# Patient Record
Sex: Female | Born: 1980 | Race: Black or African American | Hispanic: No | Marital: Married | Smoking: Never smoker
Health system: Southern US, Community
[De-identification: ages and names within clinical notes are randomized; demographics above are authoritative.]

## PROBLEM LIST (undated history)

## (undated) DIAGNOSIS — Z9889 Other specified postprocedural states: Secondary | ICD-10-CM

## (undated) DIAGNOSIS — R519 Headache, unspecified: Secondary | ICD-10-CM

## (undated) DIAGNOSIS — R112 Nausea with vomiting, unspecified: Secondary | ICD-10-CM

## (undated) DIAGNOSIS — J45909 Unspecified asthma, uncomplicated: Secondary | ICD-10-CM

## (undated) DIAGNOSIS — M21611 Bunion of right foot: Secondary | ICD-10-CM

## (undated) DIAGNOSIS — R569 Unspecified convulsions: Secondary | ICD-10-CM

## (undated) DIAGNOSIS — F32A Depression, unspecified: Secondary | ICD-10-CM

## (undated) DIAGNOSIS — A6000 Herpesviral infection of urogenital system, unspecified: Secondary | ICD-10-CM

## (undated) DIAGNOSIS — K219 Gastro-esophageal reflux disease without esophagitis: Secondary | ICD-10-CM

## (undated) HISTORY — PX: FOOT SURGERY: SHX648

---

## 2005-05-03 HISTORY — PX: TUBAL LIGATION: SHX77

## 2007-05-04 HISTORY — PX: CERVICAL BIOPSY  W/ LOOP ELECTRODE EXCISION: SUR135

## 2011-05-04 HISTORY — PX: LAPAROSCOPIC OVARIAN CYSTECTOMY: SUR786

## 2013-05-03 HISTORY — PX: ESOPHAGOGASTRODUODENOSCOPY: SHX1529

## 2015-05-04 HISTORY — PX: HERNIA REPAIR: SHX51

## 2017-09-30 ENCOUNTER — Ambulatory Visit (HOSPITAL_COMMUNITY)
Admission: EM | Admit: 2017-09-30 | Discharge: 2017-09-30 | Disposition: A | Discharge status: Home / Self Care | Payer: Managed Care, Other (non HMO) | Attending: Family Medicine | Admitting: Family Medicine

## 2017-09-30 ENCOUNTER — Encounter (HOSPITAL_COMMUNITY): Payer: Self-pay

## 2017-09-30 DIAGNOSIS — R05 Cough: Secondary | ICD-10-CM | POA: Diagnosis not present

## 2017-09-30 DIAGNOSIS — J029 Acute pharyngitis, unspecified: Secondary | ICD-10-CM | POA: Diagnosis not present

## 2017-09-30 DIAGNOSIS — J069 Acute upper respiratory infection, unspecified: Secondary | ICD-10-CM | POA: Diagnosis present

## 2017-09-30 DIAGNOSIS — B9789 Other viral agents as the cause of diseases classified elsewhere: Secondary | ICD-10-CM | POA: Diagnosis not present

## 2017-09-30 LAB — POCT RAPID STREP A: Streptococcus, Group A Screen (Direct): NEGATIVE

## 2017-09-30 MED ORDER — FLUTICASONE PROPIONATE 50 MCG/ACT NA SUSP: 1.0000 | Freq: Every day | NASAL | 0 refills | Status: DC

## 2017-09-30 MED ORDER — BENZONATATE 200 MG PO CAPS: 200.0000 mg | ORAL_CAPSULE | Freq: Three times a day (TID) | ORAL | 0 refills | Status: AC | PRN

## 2017-09-30 MED ORDER — CETIRIZINE HCL 10 MG PO CAPS: 10.0000 mg | ORAL_CAPSULE | Freq: Every day | ORAL | 0 refills | Status: DC

## 2017-09-30 NOTE — Discharge Instructions (Signed)
Sore Throat  Your rapid strep tested Negative today. We will send for a culture and call in about 2 days if results are positive. For now we will treat your sore throat as a virus with symptom management.   Please continue Tylenol or Ibuprofen for fever and pain. May try salt water gargles, cepacol lozenges, throat spray, or OTC cold relief medicine for throat discomfort. If you also have congestion take a daily anti-histamine like Zyrtec, Claritin, and a oral decongestant to help with post nasal drip that may be irritating your throat.   Stay hydrated and drink plenty of fluids to keep your throat coated relieve irritation.   For congestion please begin daily Zyrtec and Flonase nasal spray.  Please use Tessalon for cough.  I expect symptoms to gradually improve over the next week.  Please return if symptoms not improving or symptoms worsening or changing.

## 2017-09-30 NOTE — ED Triage Notes (Signed)
Pt presents with complaints of body aches, cough, congestion and sore throat since Tuesday.

## 2017-10-01 NOTE — ED Provider Notes (Signed)
[facility]    CSN: [id number] Arrival date & time: 09/30/17  1059     History   Chief Complaint Chief Complaint  Patient presents with  . Influenza    HPI Sandra Boyer is a 37 y.o. female Patient is presenting with URI symptoms- congestion, cough, sore throat.  Also endorsing body aches, headache as well as hot and cold chills.  Patient's main complaints are cough and congestion. Symptoms have been going on for 2 to 3 days. Patient has tried cough drops and TheraFlu, with minimal relief. Denies fever, nausea, vomiting, diarrhea. Denies shortness of breath and chest pain.    HPI  History reviewed. No pertinent past medical history.  There are no active problems to display for this patient.   History reviewed. No pertinent surgical history.  OB History   None      Home Medications    Prior to Admission medications   Medication Sig Start Date End Date Taking? Authorizing Provider  albuterol (ACCUNEB) 0.63 MG/3ML nebulizer solution Take 1 ampule by nebulization every 6 (six) hours as needed for wheezing.   Yes [provider]  levonorgestrel (PLAN B 1-STEP) 1.5 MG tablet Take 1.5 mg by mouth once.   Yes [provider]  benzonatate (TESSALON) 200 MG capsule Take 1 capsule (200 mg total) by mouth 3 (three) times daily as needed for up to 7 days for cough. 09/30/17 10/07/17  Madelon Welsch C, PA-C  Cetirizine HCl 10 MG CAPS Take 1 capsule (10 mg total) by mouth daily for 10 days. 09/30/17 10/10/17  Shanikia Kernodle C, PA-C  fluticasone (FLONASE) 50 MCG/ACT nasal spray Place 1-2 sprays into both nostrils daily for 7 days. 09/30/17 10/07/17  Mickie Badders, Junius CreamerHallie C, PA-C    Family History Family History  Problem Relation Age of Onset  . Hypertension Mother   . Hypertension Father     Social History Social History   Tobacco Use  . Smoking status: Never Smoker  . Smokeless tobacco: Never Used  Substance Use Topics  . Alcohol use: Not Currently   Frequency: Never  . Drug use: Never     Allergies   Carbamazepine; Iodinated diagnostic agents; Mangifera indica; Oxcarbazepine; and Phenytoin   Review of Systems Review of Systems  Constitutional: Positive for chills. Negative for fatigue and fever.  HENT: Positive for congestion, rhinorrhea, sinus pressure and sore throat. Negative for ear pain and trouble swallowing.   Respiratory: Positive for cough. Negative for chest tightness and shortness of breath.   Cardiovascular: Negative for chest pain.  Gastrointestinal: Negative for abdominal pain, nausea and vomiting.  Musculoskeletal: Positive for myalgias.  Skin: Negative for rash.  Neurological: Positive for headaches. Negative for dizziness and light-headedness.     Physical Exam Triage Vital Signs ED Triage Vitals [09/30/17 1130]  Enc Vitals Group     BP 118/77     Pulse Rate 65     Resp 18     Temp 98.4 F (36.9 C)     Temp src      SpO2 100 %     Weight      Height      Head Circumference      Peak Flow      Pain Score 7     Pain Loc      Pain Edu?      Excl. in GC?    No data found.  Updated Vital Signs BP 118/77   Pulse 65   Temp 98.4 F (  36.9 C)   Resp 18   LMP 09/30/2017   SpO2 100%   Visual Acuity Right Eye Distance:   Left Eye Distance:   Bilateral Distance:    Right Eye Near:   Left Eye Near:    Bilateral Near:     Physical Exam  Constitutional: She appears well-developed and well-nourished. No distress.  HENT:  Head: Normocephalic and atraumatic.  Bilateral ears without tenderness to palpation of external auricle, tragus and mastoid, EAC's without erythema or swelling, TM's with good bony landmarks and cone of light. Non erythematous.  Oral mucosa pink and moist, no tonsillar enlargement or exudate. Posterior pharynx patent and erythematous, no uvula deviation or swelling. Normal phonation.  Eyes: Conjunctivae are normal.  Neck: Neck supple.  Cardiovascular: Normal rate and regular  rhythm.  No murmur heard. Pulmonary/Chest: Effort normal and breath sounds normal. No respiratory distress.  Breathing comfortably at rest, CTABL, no wheezing, rales or other adventitious sounds auscultated  Abdominal: Soft. There is no tenderness.  Musculoskeletal: She exhibits no edema.  Neurological: She is alert.  Skin: Skin is warm and dry.  Psychiatric: She has a normal mood and affect.  Nursing note and vitals reviewed.    UC Treatments / Results  Labs (all labs ordered are listed, but only abnormal results are displayed) Labs Reviewed  CULTURE, GROUP A STREP [facility])  POCT RAPID STREP A    EKG None  Radiology No results found.  Procedures Procedures (including critical care time)  Medications Ordered in UC Medications - No data to display  Initial Impression / Assessment and Plan / UC Course  I have reviewed the triage vital signs and the nursing notes.  Pertinent labs & imaging results that were available during my care of the patient were reviewed by me and considered in my medical decision making (see chart for details).     Patient with URI symptoms, likely viral etiology.  Vital signs stable, exam unremarkable.  Will recommend symptomatic management.  Recommendations below.Discussed strict return precautions. Patient verbalized understanding and is agreeable with plan.  Final Clinical Impressions(s) / UC Diagnoses   Final diagnoses:  Viral URI with cough     Discharge Instructions     Sore Throat  Your rapid strep tested Negative today. We will send for a culture and call in about 2 days if results are positive. For now we will treat your sore throat as a virus with symptom management.   Please continue Tylenol or Ibuprofen for fever and pain. May try salt water gargles, cepacol lozenges, throat spray, or OTC cold relief medicine for throat discomfort. If you also have congestion take a daily anti-histamine like Zyrtec, Claritin, and a oral  decongestant to help with post nasal drip that may be irritating your throat.   Stay hydrated and drink plenty of fluids to keep your throat coated relieve irritation.   For congestion please begin daily Zyrtec and Flonase nasal spray.  Please use Tessalon for cough.  I expect symptoms to gradually improve over the next week.  Please return if symptoms not improving or symptoms worsening or changing.   ED Prescriptions    Medication Sig Dispense Auth. Provider   Cetirizine HCl 10 MG CAPS Take 1 capsule (10 mg total) by mouth daily for 10 days. 10 capsule Muskaan Smet C, PA-C   fluticasone (FLONASE) 50 MCG/ACT nasal spray Place 1-2 sprays into both nostrils daily for 7 days. 1 g Mayson Sterbenz C, PA-C   benzonatate (TESSALON) 200 MG  capsule Take 1 capsule (200 mg total) by mouth 3 (three) times daily as needed for up to 7 days for cough. 28 capsule Giovan Pinsky C, PA-C     Controlled Substance Prescriptions  Controlled Substance Registry consulted? Not Applicable   Lew Dawes, [place] 10/01/17 1136

## 2017-10-02 LAB — CULTURE, GROUP A STREP (THRC)

## 2019-03-15 ENCOUNTER — Other Ambulatory Visit: Payer: Self-pay | Admitting: Sports Medicine

## 2019-03-15 DIAGNOSIS — G8929 Other chronic pain: Secondary | ICD-10-CM

## 2019-03-15 DIAGNOSIS — M25861 Other specified joint disorders, right knee: Secondary | ICD-10-CM

## 2019-03-27 ENCOUNTER — Ambulatory Visit
Admission: RE | Admit: 2019-03-27 | Discharge: 2019-03-27 | Disposition: A | Discharge status: Home / Self Care | Payer: Managed Care, Other (non HMO) | Source: Ambulatory Visit | Attending: Sports Medicine | Admitting: Sports Medicine

## 2019-03-27 ENCOUNTER — Other Ambulatory Visit: Payer: Self-pay

## 2019-03-27 DIAGNOSIS — M25561 Pain in right knee: Secondary | ICD-10-CM | POA: Insufficient documentation

## 2019-03-27 DIAGNOSIS — G8929 Other chronic pain: Secondary | ICD-10-CM | POA: Insufficient documentation

## 2019-03-27 DIAGNOSIS — M25861 Other specified joint disorders, right knee: Secondary | ICD-10-CM | POA: Insufficient documentation

## 2019-05-17 ENCOUNTER — Other Ambulatory Visit: Payer: Self-pay | Admitting: Neurology

## 2019-05-17 DIAGNOSIS — R200 Anesthesia of skin: Secondary | ICD-10-CM

## 2019-05-17 DIAGNOSIS — R202 Paresthesia of skin: Secondary | ICD-10-CM

## 2019-05-18 ENCOUNTER — Other Ambulatory Visit: Payer: Self-pay | Admitting: Neurology

## 2019-05-18 DIAGNOSIS — R202 Paresthesia of skin: Secondary | ICD-10-CM

## 2019-05-18 DIAGNOSIS — R200 Anesthesia of skin: Secondary | ICD-10-CM

## 2019-05-18 DIAGNOSIS — G8929 Other chronic pain: Secondary | ICD-10-CM

## 2019-05-24 ENCOUNTER — Ambulatory Visit
Admission: RE | Admit: 2019-05-24 | Discharge: 2019-05-24 | Disposition: A | Discharge status: Home / Self Care | Payer: Managed Care, Other (non HMO) | Source: Ambulatory Visit | Attending: Neurology | Admitting: Neurology

## 2019-05-24 ENCOUNTER — Other Ambulatory Visit: Payer: Self-pay

## 2019-05-24 DIAGNOSIS — R200 Anesthesia of skin: Secondary | ICD-10-CM | POA: Diagnosis present

## 2019-05-24 DIAGNOSIS — M5441 Lumbago with sciatica, right side: Secondary | ICD-10-CM | POA: Diagnosis present

## 2019-05-24 DIAGNOSIS — G8929 Other chronic pain: Secondary | ICD-10-CM | POA: Insufficient documentation

## 2019-05-24 DIAGNOSIS — M5442 Lumbago with sciatica, left side: Secondary | ICD-10-CM | POA: Insufficient documentation

## 2019-05-24 DIAGNOSIS — R202 Paresthesia of skin: Secondary | ICD-10-CM | POA: Insufficient documentation

## 2020-01-14 ENCOUNTER — Other Ambulatory Visit: Payer: Self-pay | Admitting: Podiatry

## 2020-01-22 ENCOUNTER — Other Ambulatory Visit: Payer: Self-pay

## 2020-01-22 ENCOUNTER — Encounter: Payer: Self-pay | Admitting: Podiatry

## 2020-01-25 ENCOUNTER — Other Ambulatory Visit
Admission: RE | Admit: 2020-01-25 | Discharge: 2020-01-25 | Disposition: A | Discharge status: Home / Self Care | Payer: Managed Care, Other (non HMO) | Source: Ambulatory Visit | Attending: Podiatry | Admitting: Podiatry

## 2020-01-25 ENCOUNTER — Other Ambulatory Visit: Payer: Self-pay

## 2020-01-25 DIAGNOSIS — Z20822 Contact with and (suspected) exposure to covid-19: Secondary | ICD-10-CM | POA: Insufficient documentation

## 2020-01-25 DIAGNOSIS — Z01812 Encounter for preprocedural laboratory examination: Secondary | ICD-10-CM | POA: Insufficient documentation

## 2020-01-25 LAB — SARS CORONAVIRUS 2 (TAT 6-24 HRS): SARS Coronavirus 2: NEGATIVE

## 2020-01-29 ENCOUNTER — Ambulatory Visit: Payer: Managed Care, Other (non HMO) | Admitting: Anesthesiology

## 2020-01-29 ENCOUNTER — Ambulatory Visit
Admission: RE | Admit: 2020-01-29 | Discharge: 2020-01-29 | Disposition: A | Discharge status: Home / Self Care | Payer: Managed Care, Other (non HMO) | Attending: Podiatry | Admitting: Podiatry

## 2020-01-29 ENCOUNTER — Other Ambulatory Visit: Payer: Self-pay

## 2020-01-29 ENCOUNTER — Encounter: Payer: Self-pay | Admitting: Podiatry

## 2020-01-29 ENCOUNTER — Encounter
Admission: RE | Disposition: A | Discharge status: Home / Self Care | Payer: Self-pay | Source: Home / Self Care | Attending: Podiatry

## 2020-01-29 DIAGNOSIS — K219 Gastro-esophageal reflux disease without esophagitis: Secondary | ICD-10-CM | POA: Diagnosis not present

## 2020-01-29 DIAGNOSIS — M216X1 Other acquired deformities of right foot: Secondary | ICD-10-CM | POA: Diagnosis not present

## 2020-01-29 DIAGNOSIS — G629 Polyneuropathy, unspecified: Secondary | ICD-10-CM | POA: Insufficient documentation

## 2020-01-29 DIAGNOSIS — Z91041 Radiographic dye allergy status: Secondary | ICD-10-CM | POA: Insufficient documentation

## 2020-01-29 DIAGNOSIS — Z888 Allergy status to other drugs, medicaments and biological substances status: Secondary | ICD-10-CM | POA: Insufficient documentation

## 2020-01-29 DIAGNOSIS — Z09 Encounter for follow-up examination after completed treatment for conditions other than malignant neoplasm: Secondary | ICD-10-CM

## 2020-01-29 DIAGNOSIS — M216X2 Other acquired deformities of left foot: Secondary | ICD-10-CM | POA: Insufficient documentation

## 2020-01-29 DIAGNOSIS — Z79899 Other long term (current) drug therapy: Secondary | ICD-10-CM | POA: Insufficient documentation

## 2020-01-29 DIAGNOSIS — J45909 Unspecified asthma, uncomplicated: Secondary | ICD-10-CM | POA: Diagnosis not present

## 2020-01-29 DIAGNOSIS — M2011 Hallux valgus (acquired), right foot: Secondary | ICD-10-CM | POA: Insufficient documentation

## 2020-01-29 DIAGNOSIS — Z91018 Allergy to other foods: Secondary | ICD-10-CM | POA: Insufficient documentation

## 2020-01-29 DIAGNOSIS — Z793 Long term (current) use of hormonal contraceptives: Secondary | ICD-10-CM | POA: Diagnosis not present

## 2020-01-29 HISTORY — PX: HALLUX VALGUS AUSTIN: SHX6623

## 2020-01-29 HISTORY — DX: Gastro-esophageal reflux disease without esophagitis: K21.9

## 2020-01-29 HISTORY — DX: Headache, unspecified: R51.9

## 2020-01-29 HISTORY — DX: Unspecified asthma, uncomplicated: J45.909

## 2020-01-29 HISTORY — DX: Bunion of right foot: M21.611

## 2020-01-29 HISTORY — DX: Nausea with vomiting, unspecified: R11.2

## 2020-01-29 HISTORY — DX: Depression, unspecified: F32.A

## 2020-01-29 HISTORY — DX: Unspecified convulsions: R56.9

## 2020-01-29 HISTORY — DX: Other specified postprocedural states: Z98.890

## 2020-01-29 HISTORY — DX: Herpesviral infection of urogenital system, unspecified: A60.00

## 2020-01-29 LAB — POCT PREGNANCY, URINE: Preg Test, Ur: NEGATIVE

## 2020-01-29 SURGERY — CORRECTION, HALLUX VALGUS
Anesthesia: Regional | Site: Foot | Laterality: Right

## 2020-01-29 MED ORDER — ROPIVACAINE HCL 5 MG/ML IJ SOLN
INTRAMUSCULAR | Status: DC | PRN
  Administered 2020-01-29: 15 mL via PERINEURAL
  Administered 2020-01-29: 35 mL via PERINEURAL

## 2020-01-29 MED ORDER — GLYCOPYRROLATE 0.2 MG/ML IJ SOLN
INTRAMUSCULAR | Status: DC | PRN
  Administered 2020-01-29: .1 mg via INTRAVENOUS

## 2020-01-29 MED ORDER — POVIDONE-IODINE 7.5 % EX SOLN: Freq: Once | CUTANEOUS | Status: DC

## 2020-01-29 MED ORDER — MIDAZOLAM HCL 2 MG/2ML IJ SOLN
INTRAMUSCULAR | Status: DC | PRN
  Administered 2020-01-29: 2 mg via INTRAVENOUS

## 2020-01-29 MED ORDER — ASPIRIN EC 325 MG PO TBEC: 325.0000 mg | DELAYED_RELEASE_TABLET | Freq: Every day | ORAL | 0 refills | Status: AC

## 2020-01-29 MED ORDER — FENTANYL CITRATE (PF) 100 MCG/2ML IJ SOLN
INTRAMUSCULAR | Status: DC | PRN
Start: 2020-01-29 — End: 2020-01-29
  Administered 2020-01-29: 100 ug via INTRAVENOUS

## 2020-01-29 MED ORDER — LIDOCAINE HCL (CARDIAC) PF 100 MG/5ML IV SOSY
PREFILLED_SYRINGE | INTRAVENOUS | Status: DC | PRN
  Administered 2020-01-29: 50 mg via INTRAVENOUS

## 2020-01-29 MED ORDER — ONDANSETRON HCL 4 MG/2ML IJ SOLN
4.0000 mg | Freq: Once | INTRAMUSCULAR | Status: AC | PRN
  Administered 2020-01-29: 4 mg via INTRAVENOUS

## 2020-01-29 MED ORDER — LACTATED RINGERS IV SOLN: INTRAVENOUS | Status: DC

## 2020-01-29 MED ORDER — HYDROCODONE-ACETAMINOPHEN 7.5-325 MG PO TABS
1.0000 | ORAL_TABLET | Freq: Four times a day (QID) | ORAL | 0 refills | Status: AC | PRN
Start: 2020-01-29 — End: 2020-02-05

## 2020-01-29 MED ORDER — DEXAMETHASONE SODIUM PHOSPHATE 4 MG/ML IJ SOLN
INTRAMUSCULAR | Status: DC | PRN
  Administered 2020-01-29: 4 mg via PERINEURAL

## 2020-01-29 MED ORDER — SCOPOLAMINE 1 MG/3DAYS TD PT72
1.0000 | MEDICATED_PATCH | TRANSDERMAL | Status: DC
  Administered 2020-01-29: 1.5 mg via TRANSDERMAL

## 2020-01-29 MED ORDER — PROPOFOL 500 MG/50ML IV EMUL
INTRAVENOUS | Status: DC | PRN
  Administered 2020-01-29: 100 ug/kg/min via INTRAVENOUS

## 2020-01-29 MED ORDER — OXYCODONE HCL 5 MG/5ML PO SOLN: 5.0000 mg | Freq: Once | ORAL | Status: DC | PRN

## 2020-01-29 MED ORDER — DROPERIDOL 2.5 MG/ML IJ SOLN: 0.6250 mg | Freq: Once | INTRAMUSCULAR | Status: DC | PRN

## 2020-01-29 MED ORDER — ONDANSETRON HCL 4 MG PO TABS: 4.0000 mg | ORAL_TABLET | Freq: Three times a day (TID) | ORAL | 0 refills | Status: AC | PRN

## 2020-01-29 MED ORDER — ONDANSETRON HCL 4 MG/2ML IJ SOLN
INTRAMUSCULAR | Status: DC | PRN
  Administered 2020-01-29: 4 mg via INTRAVENOUS

## 2020-01-29 MED ORDER — PROPOFOL 10 MG/ML IV BOLUS
INTRAVENOUS | Status: DC | PRN
  Administered 2020-01-29 (×2): 50 mg via INTRAVENOUS

## 2020-01-29 MED ORDER — OXYCODONE HCL 5 MG PO TABS: 5.0000 mg | ORAL_TABLET | Freq: Once | ORAL | Status: DC | PRN

## 2020-01-29 MED ORDER — CEFAZOLIN SODIUM-DEXTROSE 2-4 GM/100ML-% IV SOLN
2.0000 g | INTRAVENOUS | Status: AC
  Administered 2020-01-29: 2 g via INTRAVENOUS

## 2020-01-29 MED ORDER — CEPHALEXIN 500 MG PO CAPS: 500.0000 mg | ORAL_CAPSULE | Freq: Three times a day (TID) | ORAL | 0 refills | Status: AC

## 2020-01-29 MED ORDER — PROMETHAZINE HCL 25 MG/ML IJ SOLN: 6.2500 mg | INTRAMUSCULAR | Status: DC | PRN

## 2020-01-29 MED ORDER — FENTANYL CITRATE (PF) 100 MCG/2ML IJ SOLN: 25.0000 ug | INTRAMUSCULAR | Status: DC | PRN

## 2020-01-29 SURGICAL SUPPLY — 38 items
BIT DRILL 2.1 LONG CANN (MISCELLANEOUS) ×3 IMPLANT
BLADE OSC/SAGITTAL MD 9X18.5 (BLADE) ×3 IMPLANT
BNDG CMPR STD VLCR NS LF 5.8X4 (GAUZE/BANDAGES/DRESSINGS) ×2
BNDG CMPR STD VLCR NS LF 5.8X6 (GAUZE/BANDAGES/DRESSINGS) ×2
BNDG ELASTIC 4X5.8 VLCR NS LF (GAUZE/BANDAGES/DRESSINGS) ×4 IMPLANT
BNDG ELASTIC 6X5.8 VLCR NS LF (GAUZE/BANDAGES/DRESSINGS) ×4 IMPLANT
BNDG ESMARK 4X12 TAN STRL LF (GAUZE/BANDAGES/DRESSINGS) ×4 IMPLANT
BNDG GAUZE 4.5X4.1 6PLY STRL (MISCELLANEOUS) ×4 IMPLANT
CANISTER SUCT 1200ML W/VALVE (MISCELLANEOUS) ×4 IMPLANT
CLIP HMST11XOPN 235X2.8X (MISCELLANEOUS) ×1 IMPLANT
CLIP RESOLUTION 360 11X235 (MISCELLANEOUS) ×4
COVER LIGHT HANDLE FLEXIBLE (MISCELLANEOUS) ×8 IMPLANT
CUFF TOURN SGL QUICK 18X4 (TOURNIQUET CUFF) ×4 IMPLANT
DRAPE FLUOR MINI C-ARM 54X84 (DRAPES) ×4 IMPLANT
DURAPREP 26ML APPLICATOR (WOUND CARE) ×4 IMPLANT
ELECT REM PT RETURN 9FT ADLT (ELECTROSURGICAL) ×4
ELECTRODE REM PT RTRN 9FT ADLT (ELECTROSURGICAL) ×2 IMPLANT
GAUZE SPONGE 4X4 12PLY STRL (GAUZE/BANDAGES/DRESSINGS) ×4 IMPLANT
GAUZE XEROFORM 1X8 LF (GAUZE/BANDAGES/DRESSINGS) ×4 IMPLANT
GLOVE BIO SURGEON STRL SZ7 (GLOVE) ×4 IMPLANT
GLOVE BIOGEL PI IND STRL 7.0 (GLOVE) ×3 IMPLANT
GLOVE BIOGEL PI INDICATOR 7.0 (GLOVE) ×4
GOWN STRL REUS W/ TWL LRG LVL3 (GOWN DISPOSABLE) ×4 IMPLANT
GOWN STRL REUS W/TWL LRG LVL3 (GOWN DISPOSABLE) ×8
KIT TURNOVER KIT A (KITS) ×4 IMPLANT
NS IRRIG 500ML POUR BTL (IV SOLUTION) ×4 IMPLANT
PACK EXTREMITY ARMC (MISCELLANEOUS) ×4 IMPLANT
PADDING CAST BLEND 4X4 NS (MISCELLANEOUS) ×16 IMPLANT
PENCIL SMOKE EVACUATOR (MISCELLANEOUS) ×4 IMPLANT
SCREW CANN HEAD ST 3.0X20 (Screw) ×3 IMPLANT
SCREW HEADED COUNTER SINK 3.0 ×3 IMPLANT
SPLINT CAST 1 STEP 4X30 (MISCELLANEOUS) ×4 IMPLANT
STOCKINETTE IMPERVIOUS LG (DRAPES) ×4 IMPLANT
SUT MNCRL 4-0 (SUTURE) ×4
SUT MNCRL 4-0 27XMFL (SUTURE) ×2
SUT VIC AB 3-0 SH 27 (SUTURE) ×4
SUT VIC AB 3-0 SH 27X BRD (SUTURE) ×1 IMPLANT
SUTURE MNCRL 4-0 27XMF (SUTURE) ×1 IMPLANT

## 2020-01-29 NOTE — Anesthesia Procedure Notes (Deleted)
Anesthesia Procedure Note     

## 2020-01-29 NOTE — Anesthesia Procedure Notes (Signed)
Anesthesia Regional Block: Popliteal block   Pre-Anesthetic Checklist: ,, timeout performed, Correct Patient, Correct Site, Correct Laterality, Correct Procedure, Correct Position, site marked, Risks and benefits discussed,  Surgical consent,  Pre-op evaluation,  At surgeon's request and post-op pain management  Laterality: Right  Prep: chloraprep       Needles:  Injection technique: Single-shot  Needle Type: Stimiplex     Needle Length: 9cm  Needle Gauge: 21     Additional Needles:   Procedures:,,,, ultrasound used (permanent image in chart),,,,  Narrative:  Start time: 01/29/2020 11:25 AM End time: 01/29/2020 11:30 AM Injection made incrementally with aspirations every 5 mL.  Performed by: Personally  Anesthesiologist: Jarome Matin, MD

## 2020-01-29 NOTE — Anesthesia Procedure Notes (Signed)
Anesthesia Regional Block: Adductor canal block   Pre-Anesthetic Checklist: ,, timeout performed, Correct Patient, Correct Site, Correct Laterality, Correct Procedure, Correct Position, site marked, Risks and benefits discussed,  Surgical consent,  Pre-op evaluation,  At surgeon's request and post-op pain management  Laterality: Right  Prep: chloraprep       Needles:  Injection technique: Single-shot  Needle Type: Stimiplex     Needle Length: 9cm  Needle Gauge: 21     Additional Needles:   Procedures:,,,, ultrasound used (permanent image in chart),,,,  Narrative:  Start time: 01/29/2020 11:20 AM End time: 01/29/2020 11:25 AM  Performed by: Personally  Anesthesiologist: Jarome Matin, MD

## 2020-01-29 NOTE — H&P (Signed)
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  The various methods of treatment have been discussed with the patient.  No guarantees were given.  After consideration of risks, benefits and other options for treatment, the patient has consented to surgery.  I have reviewed the patients' chart and labs.    PROCEDURE: RIGHT AUSTIN/AKIN BUNIONECTOMY   A history and physical examination was performed in my office.  The patient was reexamined.  There have been no changes to this history and physical examination.  Rosetta Posner, DPM

## 2020-01-29 NOTE — Anesthesia Postprocedure Evaluation (Signed)
Anesthesia Post Note  Patient: Sandra Boyer  Procedure(s) Performed: HALLUX VALGUS AUSTIN RIGHT (Right Foot)     Patient location during evaluation: PACU Anesthesia Type: Regional Level of consciousness: awake and alert Pain management: pain level controlled Vital Signs Assessment: post-procedure vital signs reviewed and stable Respiratory status: spontaneous breathing, nonlabored ventilation, respiratory function stable and patient connected to nasal cannula oxygen Cardiovascular status: blood pressure returned to baseline and stable Postop Assessment: no apparent nausea or vomiting Anesthetic complications: no   No complications documented.  Gayland Curry Johanan Skorupski

## 2020-01-29 NOTE — Op Note (Signed)
PODIATRY / FOOT AND ANKLE SURGERY OPERATIVE REPORT    SURGEON: Caroline More, DPM  PRE-OPERATIVE DIAGNOSIS:  1.  Right hallux valgus  POST-OPERATIVE DIAGNOSIS: Same  PROCEDURE(S): 1. Right Austin bunionectomy with modified McBride procedure and medial capsulorrhaphy  HEMOSTASIS: Right ankle tourniquet  ANESTHESIA: MAC  ESTIMATED BLOOD LOSS: 10 cc  FINDING(S): 1.  Right hallux valgus with prominent medial eminence  PATHOLOGY/SPECIMEN(S): None  INDICATIONS:   Sandra Boyer is a 39 y.o. female who presents with a painful bunion to the right first metatarsal phalangeal joint.  Patient has been worked up for this point and has exhausted conservative measures and presents today for surgery.  Discussed all treatment options with the patient prior to surgery both conservative and surgical attempts at correction.  Patient feels as though she is exhausted all conservative measures and would like to proceed with surgery today consisting of right Austin bunionectomy with potential Akin osteotomy..  DESCRIPTION: After obtaining full informed written consent, the patient was brought back to the operating room and placed supine upon the operating table.  The patient received IV antibiotics prior to induction.  After obtaining adequate anesthesia, the patient was prepped and draped in the standard fashion.  An Esmarch bandage used to exsanguinate the right lower extremity and the pneumatic ankle tourniquet was inflated.  Attention was then directed to the dorsal medial aspect of the right first metatarsal phalangeal joint where a linear longitudinal incision was made over this area.  The incision was deepened to the subcutaneous tissues utilizing sharp and blunt dissection care was taken to identify and retract all vital neurovascular structures and all venous contributories were cauterized as necessary.  At this time a linear capsulotomy was made over the dorsal medial first metatarsal phalangeal joint  medial to the extensor hallucis longus tendon.  The capsular and periosteal tissue was reflected medially and laterally thereby exposing the first metatarsal head and proximal phalanx base at the operative site.  The sagittal bone saw was then used to resect the medial eminence which was passed off the operative site and a small dorsal medial eminence was also resected and passed off the operative site.  Attention was then directed to the first interspace via the same incision where the extensor hallucis longus tendon was retracted medially and the skin and subcutaneous tissue was retracted laterally.  Dissection was then continued down to the capsule where a lateral release was performed releasing the suspensory and lateral capsule ligaments and the conjoined tendon of the adductor hallucis.  The hallux abductus angle appeared to be in relatively normal position at this time.  Attention was then directed to the medial aspect of the first metatarsal head area where a Austin type osteotomy was performed with the apex of the osteotomy being the central aspect of the metatarsal head with arms extending dorsal proximal and plantar proximal leaving a slightly longer plantar on the dorsal arm.  The capital fragment was then shifted approximately 3 to 4 mm laterally and then held into place with a K wire for 3.0 cannulated Paragon 28 screw that was directed through the distal first metatarsal shaft dorsally across the osteotomy site and into the plantar distal aspect of the capital fragment.  C-arm imaging was utilized to verify correct position which appeared to be excellent.  The sesamoids appeared to be underneath the first metatarsal head and the wire appeared to be in the appropriate position overall.  The intermetatarsal angle appeared to be reduced to within normal limits around 8  to 9 degrees.  The hallux abductus angle also appeared to be relatively rectus overall.  Utilizing standard AO principles techniques a  3.0 x 20 mm partially-threaded Paragon 28 cannulated screw was placed across the osteotomy site from dorsal plantar with excellent compression noted.  The guidewire was removed and passed off the operative site.  The medial overhang was then resected with a sagittal bone saw.  A flush was performed with copious amounts normal sterile saline at the operative site.  C-arm imaging was utilized to verify correct position of screwing which appeared to be excellent.  It appeared to be of the appropriate length as well.  The capsular and periosteal tissues were then reapproximated well coapted after a small section of the medial capsule was resected to tighten the medial capsule to make the toe slightly straighter.  The toe appeared to sit in a rectus position overall radiographically with the C arm and with clinical evaluation.  The capsule and periosteal tissue was reapproximated well coapted with 3-0 Vicryl.  The subcutaneous tissue was reapproximated well coapted with 4-0 Monocryl.  The subcuticular tissue/skin was approximated well coapted with a running 4-0 Monocryl subcuticular stitch.  Mastisol and Steri-Strips were then applied.  A postoperative dressing was applied consisting of Xeroform to the incision line followed by 4 x 4 gauze, conform, Kerlix, Ace wrap.  The pneumatic ankle tourniquet was deflated and a prompt hyperemic response was noted all digits of the right foot.  Final C-arm imaging was then taken showing correction of the bunion deformity with first intermetatarsal space being within normal limits to about 8 to 9 degrees with the hallux abductus angle appearing relatively neutral to slight abductus 5 degrees.  The patient's boot was then applied.  The patient tolerated the procedure and anesthesia well was transferred to recovery room vital signs stable vascular status intact all toes the right foot.  Following appear to postoperative monitoring the patient be discharged home with the appropriate  follow-up and discharge instructions and medications have been sent to the pharmacy.  Patient should follow-up in clinic within 1 week of surgical date.  COMPLICATIONS: None  CONDITION: Good, stable  Caroline More, DPM

## 2020-01-29 NOTE — Discharge Instructions (Signed)
[facility]  POST OPERATIVE INSTRUCTIONS FOR DR. TROXLER, DR. Ether Griffins, AND [facility]   1. Take your medication as prescribed.  Pain medication should be taken only as needed.  Take 1 Norco 7/325 every 6 hours as needed for pain.  May take ibuprofen or tylenol between pain doses if needed.  If pain still uncontrolled then take medication every 4 hours as needed for pain.  If still extreme pain then take 2 pills every 6 hours as needed.  Take sparringly.  2. Keep the dressing clean, dry and intact.  3. Keep your foot elevated above the heart level for the first 48 hours.  Continue elevation thereafter for swelling control.  4. Walking to the bathroom and brief periods of walking are acceptable, unless we have instructed you to be non-weight bearing.  Wear the boot at all times when ambulating and try to walk on heel only for short distances and stay off foot for the most part.  5. Always wear your post-op boot when walking.  Always use your crutches if you are to be non-weight bearing.  6. Do not take a shower. Baths are permissible as long as the foot is kept out of the water.   7. Every hour you are awake:  - Bend your knee 15 times. - Flex foot 15 times - Massage calf 15 times  8. Call [facility] [phone]) if any of the following problems occur: - You develop a temperature or fever. - The bandage becomes saturated with blood. - Medication does not stop your pain. - Injury of the foot occurs. - Any symptoms of infection including redness, odor, or red streaks running from wound.   General Anesthesia, Adult, Care After This sheet gives you information about how to care for yourself after your procedure. Your health care provider may also give you more specific instructions. If you have problems or questions, contact your health care provider. What can I expect after the procedure? After the  procedure, the following side effects are common:  Pain or discomfort at the IV site.  Nausea.  Vomiting.  Sore throat.  Trouble concentrating.  Feeling cold or chills.  Weak or tired.  Sleepiness and fatigue.  Soreness and body aches. These side effects can affect parts of the body that were not involved in surgery. Follow these instructions at home:  For at least 24 hours after the procedure:  Have a responsible adult stay with you. It is important to have someone help care for you until you are awake and alert.  Rest as needed.  Do not: ? Participate in activities in which you could fall or become injured. ? Drive. ? Use heavy machinery. ? Drink alcohol. ? Take sleeping pills or medicines that cause drowsiness. ? Make important decisions or sign legal documents. ? Take care of children on your own. Eating and drinking  Follow any instructions from your health care provider about eating or drinking restrictions.  When you feel hungry, start by eating small amounts of foods that are soft and easy to digest (bland), such as toast. Gradually return to your regular diet.  Drink enough fluid to keep your urine pale yellow.  If you vomit, rehydrate by drinking water, juice, or clear broth. General instructions  If you have sleep apnea, surgery and certain medicines can increase your risk for breathing problems. Follow instructions from your health care provider about wearing your sleep device: ? Anytime you  are sleeping, including during daytime naps. ? While taking prescription pain medicines, sleeping medicines, or medicines that make you drowsy.  Return to your normal activities as told by your health care provider. Ask your health care provider what activities are safe for you.  Take over-the-counter and prescription medicines only as told by your health care provider.  If you smoke, do not smoke without supervision.  Keep all follow-up visits as told by your  health care provider. This is important. Contact a health care provider if:  You have nausea or vomiting that does not get better with medicine.  You cannot eat or drink without vomiting.  You have pain that does not get better with medicine.  You are unable to pass urine.  You develop a skin rash.  You have a fever.  You have redness around your IV site that gets worse. Get help right away if:  You have difficulty breathing.  You have chest pain.  You have blood in your urine or stool, or you vomit blood. Summary  After the procedure, it is common to have a sore throat or nausea. It is also common to feel tired.  Have a responsible adult stay with you for the first 24 hours after general anesthesia. It is important to have someone help care for you until you are awake and alert.  When you feel hungry, start by eating small amounts of foods that are soft and easy to digest (bland), such as toast. Gradually return to your regular diet.  Drink enough fluid to keep your urine pale yellow.  Return to your normal activities as told by your health care provider. Ask your health care provider what activities are safe for you. This information is not intended to replace advice given to you by your health care provider. Make sure you discuss any questions you have with your health care provider. Document Revised: 04/22/2017 Document Reviewed: 12/03/2016 Elsevier Patient Education  2020 Elsevier Inc.  Scopolamine skin patches Remove in 72 hrs. Wash hands immediately after removal. What is this medicine? SCOPOLAMINE (skoe POL a meen) is used to prevent nausea and vomiting caused by motion sickness, anesthesia and surgery. This medicine may be used for other purposes; ask your health care provider or pharmacist if you have questions. COMMON BRAND NAME(S): Transderm Scop What should I tell my health care provider before I take this medicine? They need to know if you have any of these  conditions:  are scheduled to have a gastric secretion test  glaucoma  heart disease  kidney disease  liver disease  lung or breathing disease, like asthma  mental illness  prostate disease  seizures  stomach or intestine problems  trouble passing urine  an unusual or allergic reaction to scopolamine, atropine, other medicines, foods, dyes, or preservatives  pregnant or trying to get pregnant  breast-feeding How should I use this medicine? This medicine is for external use only. Follow the directions on the prescription label. Wear only 1 patch at a time. Choose an area behind the ear, that is clean, dry, hairless and free from any cuts or irritation. Wipe the area with a clean dry tissue. Peel off the plastic backing of the skin patch, trying not to touch the adhesive side with your hands. Do not cut the patches. Firmly apply to the area you have chosen, with the metallic side of the patch to the skin and the tan-colored side showing. Once firmly in place, wash your hands well with soap  and water. Do not get this medicine into your eyes. After removing the patch, wash your hands and the area behind your ear thoroughly with soap and water. The patch will still contain some medicine after use. To avoid accidental contact or ingestion by children or pets, fold the used patch in half with the sticky side together and throw away in the trash out of the reach of children and pets. If you need to use a second patch after you remove the first, place it behind the other ear. A special MedGuide will be given to you by the pharmacist with each prescription and refill. Be sure to read this information carefully each time. Talk to your pediatrician regarding the use of this medicine in children. Special care may be needed. Overdosage: If you think you have taken too much of this medicine contact a poison control center or emergency room at once. NOTE: This medicine is only for you. Do not  share this medicine with others. What if I miss a dose? This does not apply. This medicine is not for regular use. What may interact with this medicine?  alcohol  antihistamines for allergy cough and cold  atropine  certain medicines for anxiety or sleep  certain medicines for bladder problems like oxybutynin, tolterodine  certain medicines for depression like amitriptyline, fluoxetine, sertraline  certain medicines for stomach problems like dicyclomine, hyoscyamine  certain medicines for Parkinson's disease like benztropine, trihexyphenidyl  certain medicines for seizures like phenobarbital, primidone  general anesthetics like halothane, isoflurane, methoxyflurane, propofol  ipratropium  local anesthetics like lidocaine, pramoxine, tetracaine  medicines that relax muscles for surgery  phenothiazines like chlorpromazine, mesoridazine, prochlorperazine, thioridazine  narcotic medicines for pain  other belladonna alkaloids This list may not describe all possible interactions. Give your health care provider a list of all the medicines, herbs, non-prescription drugs, or dietary supplements you use. Also tell them if you smoke, drink alcohol, or use illegal drugs. Some items may interact with your medicine. What should I watch for while using this medicine? Limit contact with water while swimming and bathing because the patch may fall off. If the patch falls off, throw it away and put a new one behind the other ear. You may get drowsy or dizzy. Do not drive, use machinery, or do anything that needs mental alertness until you know how this medicine affects you. Do not stand or sit up quickly, especially if you are an older patient. This reduces the risk of dizzy or fainting spells. Alcohol may interfere with the effect of this medicine. Avoid alcoholic drinks. Your mouth may get dry. Chewing sugarless gum or sucking hard candy, and drinking plenty of water may help. Contact your  healthcare professional if the problem does not go away or is severe. This medicine may cause dry eyes and blurred vision. If you wear contact lenses, you may feel some discomfort. Lubricating drops may help. See your healthcare professional if the problem does not go away or is severe. If you are going to need surgery, an MRI, CT scan, or other procedure, tell your healthcare professional that you are using this medicine. You may need to remove the patch before the procedure. What side effects may I notice from receiving this medicine? Side effects that you should report to your doctor or health care professional as soon as possible:  allergic reactions like skin rash, itching or hives; swelling of the face, lips, or tongue  blurred vision  changes in vision  confusion  dizziness  eye pain  fast, irregular heartbeat  hallucinations, loss of contact with reality  nausea, vomiting  pain or trouble passing urine  restlessness  seizures  skin irritation  stomach pain Side effects that usually do not require medical attention (report to your doctor or health care professional if they continue or are bothersome):  drowsiness  dry mouth  headache  sore throat This list may not describe all possible side effects. Call your doctor for medical advice about side effects. You may report side effects to FDA at 1-800-FDA-1088. Where should I keep my medicine? Keep out of the reach of children. Store at room temperature between 20 and 25 degrees C (68 and 77 degrees F). Keep this medicine in the foil package until ready to use. Throw away any unused medicine after the expiration date. NOTE: This sheet is a summary. It may not cover all possible information. If you have questions about this medicine, talk to your doctor, pharmacist, or health care provider.  2020 Elsevier/Gold Standard (2017-07-08 16:14:46)

## 2020-01-29 NOTE — Anesthesia Procedure Notes (Signed)
Date/Time: 01/29/2020 12:07 PM Performed by: Maree Krabbe, CRNA Pre-anesthesia Checklist: Patient identified, Emergency Drugs available, Suction available, Timeout performed and Patient being monitored Patient Re-evaluated:Patient Re-evaluated prior to induction Oxygen Delivery Method: Nasal cannula Placement Confirmation: positive ETCO2

## 2020-01-29 NOTE — Transfer of Care (Signed)
Immediate Anesthesia Transfer of Care Note  Patient: Sandra Boyer  Procedure(s) Performed: HALLUX VALGUS AUSTIN RIGHT (Right Foot)  Patient Location: PACU  Anesthesia Type: General, Regional  Level of Consciousness: awake, alert  and patient cooperative  Airway and Oxygen Therapy: Patient Spontanous Breathing and Patient connected to supplemental oxygen  Post-op Assessment: Post-op Vital signs reviewed, Patient's Cardiovascular Status Stable, Respiratory Function Stable, Patent Airway and No signs of Nausea or vomiting  Post-op Vital Signs: Reviewed and stable  Complications: No complications documented.

## 2020-01-29 NOTE — Anesthesia Preprocedure Evaluation (Signed)
Anesthesia Evaluation  Patient identified by MRN, date of birth, ID band  History of Anesthesia Complications (+) PONV and history of anesthetic complications (ponv)  Airway Mallampati: II  TM Distance: >3 FB Neck ROM: Full    Dental   Pulmonary asthma ,    Pulmonary exam normal        Cardiovascular negative cardio ROS Normal cardiovascular exam     Neuro/Psych  Headaches, PSYCHIATRIC DISORDERS Depression    GI/Hepatic GERD  ,  Endo/Other    Renal/GU      Musculoskeletal   Abdominal   Peds  Hematology   Anesthesia Other Findings   Reproductive/Obstetrics                             Anesthesia Physical Anesthesia Plan  ASA: II  Anesthesia Plan: General and Regional   Post-op Pain Management: GA combined w/ Regional for post-op pain   Induction: Intravenous  PONV Risk Score and Plan: 2 and Ondansetron, Propofol infusion and TIVA  Airway Management Planned: Simple Face Mask and Natural Airway  Additional Equipment:   Intra-op Plan:   Post-operative Plan:   Informed Consent: I have reviewed the patients History and Physical, chart, labs and discussed the procedure including the risks, benefits and alternatives for the proposed anesthesia with the patient or authorized representative who has indicated his/her understanding and acceptance.       Plan Discussed with: CRNA, Anesthesiologist and Surgeon  Anesthesia Plan Comments:         Anesthesia Quick Evaluation

## 2020-01-30 ENCOUNTER — Encounter: Payer: Self-pay | Admitting: Podiatry

## 2020-10-07 ENCOUNTER — Encounter: Payer: Self-pay | Admitting: Emergency Medicine

## 2020-10-07 ENCOUNTER — Other Ambulatory Visit: Payer: Self-pay

## 2020-10-07 ENCOUNTER — Emergency Department: Payer: Managed Care, Other (non HMO)

## 2020-10-07 ENCOUNTER — Emergency Department
Admission: EM | Admit: 2020-10-07 | Discharge: 2020-10-07 | Disposition: A | Discharge status: Home / Self Care | Payer: Managed Care, Other (non HMO) | Attending: Emergency Medicine | Admitting: Emergency Medicine

## 2020-10-07 DIAGNOSIS — R600 Localized edema: Secondary | ICD-10-CM | POA: Diagnosis not present

## 2020-10-07 DIAGNOSIS — J45909 Unspecified asthma, uncomplicated: Secondary | ICD-10-CM | POA: Insufficient documentation

## 2020-10-07 DIAGNOSIS — Z7951 Long term (current) use of inhaled steroids: Secondary | ICD-10-CM | POA: Insufficient documentation

## 2020-10-07 DIAGNOSIS — R609 Edema, unspecified: Secondary | ICD-10-CM

## 2020-10-07 DIAGNOSIS — M79671 Pain in right foot: Secondary | ICD-10-CM | POA: Diagnosis present

## 2020-10-07 NOTE — ED Provider Notes (Signed)
[facility] Emergency Department Provider Note   ____________________________________________   Event Date/Time   First MD Initiated Contact with Patient 10/07/20 1144     (approximate)  I have reviewed the triage vital signs and the nursing notes.   HISTORY  Chief Complaint Foot Pain    HPI Sandra Boyer is a 40 y.o. female patient complaining pain and edema in the right lower extremity since September 2021.  Patient state onset of complaint status post surgery to right foot secondary to bunionectomy.  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  Patient denies loss of sensation.         Past Medical History:  Diagnosis Date  . Asthma    allergy and seasonal  . Bunion of right foot    painful  . Depression   . Genital herpes   . GERD (gastroesophageal reflux disease)   . Headache    migraines-coffee induced/ none recently  . PONV (postoperative nausea and vomiting)   . Seizure (HCC)    stress related/ none since 2012    There are no problems to display for this patient.   Past Surgical History:  Procedure Laterality Date  . CERVICAL BIOPSY  W/ LOOP ELECTRODE EXCISION  2009  . ESOPHAGOGASTRODUODENOSCOPY  2015  . FOOT SURGERY Right   . HALLUX VALGUS AUSTIN Right 01/29/2020   Procedure: HALLUX VALGUS AUSTIN RIGHT;  Surgeon: Rosetta Posner, DPM;  Location: [facility];  Service: Podiatry;  Laterality: Right;  . HERNIA REPAIR  2017   umbilical  . LAPAROSCOPIC OVARIAN CYSTECTOMY  2013  . TUBAL LIGATION  2007   laparoscopic    Prior to Admission medications   Medication Sig Start Date End Date Taking? Authorizing Provider  acyclovir (ZOVIRAX) 400 MG tablet Take 400 mg by mouth 3 (three) times daily as needed. Patient not taking: Reported on 01/22/2020    [provider]  albuterol (VENTOLIN HFA) 108 (90 Base) MCG/ACT inhaler Inhale 2 puffs into the lungs every 6 (six)  hours as needed for wheezing or shortness of breath.    [provider]  Azelastine HCl 137 MCG/SPRAY SOLN Place into the nose.    [provider]  Cetirizine HCl 10 MG CAPS Take 1 capsule (10 mg total) by mouth daily for 10 days. 09/30/17 01/29/20  Wieters, Hallie C, PA-C  fluticasone (FLONASE) 50 MCG/ACT nasal spray Place 1-2 sprays into both nostrils daily for 7 days. 09/30/17 01/29/20  Wieters, Hallie C, PA-C  levonorgestrel (PLAN B 1-STEP) 1.5 MG tablet Take 1.5 mg by mouth once.    [provider]  Multiple Vitamins-Calcium (ONE-A-DAY WOMENS FORMULA PO) Take by mouth.    [provider]  omeprazole (PRILOSEC) 20 MG capsule Take 20 mg by mouth daily.    [provider]    Allergies Carbamazepine, Iodinated diagnostic agents, Mangifera indica, Oxcarbazepine, and Phenytoin  Family History  Problem Relation Age of Onset  . Hypertension Mother   . Hypertension Father   . Asthma Father   . Asthma Brother   . Anxiety disorder Daughter   . Depression Daughter   . Breast cancer Maternal Aunt     Social History Social History   Tobacco Use  . Smoking status: Never Smoker  . Smokeless tobacco: Never Used  Substance Use Topics  . Alcohol use: Not Currently  . Drug use: Never    Review of Systems Constitutional: No fever/chills Eyes: No visual  changes. ENT: No sore throat. Cardiovascular: Denies chest pain. Respiratory: Denies shortness of breath. Gastrointestinal: No abdominal pain.  No nausea, no vomiting.  No diarrhea.  No constipation. Genitourinary: Negative for dysuria. Musculoskeletal: Right foot pain. Skin: Negative for rash.  Right foot edema. Neurological: Negative for headaches, focal weakness or numbness. Psychiatric:  Depression Allergic/Immunilogical: See medication allergy list. ____________________________________________   PHYSICAL EXAM:  VITAL SIGNS: ED Triage Vitals  Enc Vitals Group     BP 10/07/20 1115  133/88     Pulse Rate 10/07/20 1113 74     Resp 10/07/20 1113 16     Temp 10/07/20 1113 98.4 F (36.9 C)     Temp Source 10/07/20 1113 Oral     SpO2 10/07/20 1113 100 %     Weight 10/07/20 1114 173 lb 1 oz (78.5 kg)     Height 10/07/20 1114 5\' 7"  (1.702 m)     Head Circumference --      Peak Flow --      Pain Score 10/07/20 1114 0     Pain Loc --      Pain Edu? --      Excl. in GC? --     Constitutional: Alert and oriented. Well appearing and in no acute distress. Cardiovascular: Normal rate, regular rhythm. Grossly normal heart sounds.  Good peripheral circulation. Respiratory: Normal respiratory effort.  No retractions. Lungs CTAB. Gastrointestinal: Soft and nontender. No distention. No abdominal bruits. No CVA tenderness. Genitourinary: Deferred Musculoskeletal: No lower extremity tenderness nor edema.  No joint effusions. Neurologic:  Normal speech and language. No gross focal neurologic deficits are appreciated. No gait instability. Skin:  Skin is warm, dry and intact. No rash noted. Psychiatric: Mood and affect are normal. Speech and behavior are normal.  ____________________________________________   LABS (all labs ordered are listed, but only abnormal results are displayed)  Labs Reviewed - No data to display ____________________________________________  EKG   ____________________________________________  RADIOLOGY I, 12/07/20, personally viewed and evaluated these images (plain radiographs) as part of my medical decision making, as well as reviewing the written report by the radiologist.  ED MD interpretation:    Official radiology report(s): Joni Reining Venous Img Lower Unilateral Right  Result Date: 10/07/2020 CLINICAL DATA:  Right lower extremity pain and edema EXAM: RIGHT LOWER EXTREMITY VENOUS DOPPLER ULTRASOUND TECHNIQUE: Gray-scale sonography with compression, as well as color and duplex ultrasound, were performed to evaluate the deep venous system(s) from  the level of the common femoral vein through the popliteal and proximal calf veins. COMPARISON:  None. FINDINGS: VENOUS Normal compressibility of the common femoral, superficial femoral, and popliteal veins, as well as the visualized calf veins. Visualized portions of profunda femoral vein and great saphenous vein unremarkable. No filling defects to suggest DVT on grayscale or color Doppler imaging. Doppler waveforms show normal direction of venous flow, normal respiratory plasticity and response to augmentation. Limited views of the contralateral common femoral vein are unremarkable. OTHER None. Limitations: none IMPRESSION: Negative. Electronically Signed   By: 12/07/2020 M.D.   On: 10/07/2020 14:00    ____________________________________________   PROCEDURES  Procedure(s) performed (including Critical Care):  Procedures   ____________________________________________   INITIAL IMPRESSION / ASSESSMENT AND PLAN / ED COURSE  As part of my medical decision making, I reviewed the following data within the electronic MEDICAL RECORD NUMBER         Patient complain of right foot pain and swelling to the right lower leg since September 2021.  Patient voices concern for blood clot since this is after surgical procedure.  Patient denies dyspnea or chest pain.  Further evaluation of ultrasound was negative for DVT.  Patient complaining physical exam consistent with peripheral edema.  Patient given discharge care instruction and advised to follow-up with PCP.  Advised to consider using compression stockings.      ____________________________________________   FINAL CLINICAL IMPRESSION(S) / ED DIAGNOSES  Final diagnoses:  Peripheral edema     ED Discharge Orders    None       Note:  This document was prepared using Dragon voice recognition software and may include unintentional dictation errors.    Joni Reining, PA-C 10/07/20 1418    Gilles Chiquito, MD 10/07/20 [phone]

## 2020-10-07 NOTE — ED Triage Notes (Signed)
C/o right foot pain and swelling and right lower leg swelling since foot surgery September 2021.

## 2020-10-07 NOTE — Discharge Instructions (Signed)
Your ultrasound was negative for DVT.  Read and follow discharge care instruction.  Consider wearing compression stockings during the day.  Follow-up with podiatry or your PCP.

## 2020-10-07 NOTE — ED Notes (Signed)
See triage note  Presents with pain to right foot and posterior lower leg  States she had surgery to foot last Sept  Has had issues since  No swelling noted at present  Positive pulses

## 2021-06-17 ENCOUNTER — Encounter (HOSPITAL_COMMUNITY): Payer: Self-pay | Admitting: Emergency Medicine

## 2021-06-17 ENCOUNTER — Ambulatory Visit (HOSPITAL_COMMUNITY)
Admission: EM | Admit: 2021-06-17 | Discharge: 2021-06-17 | Disposition: A | Discharge status: Home / Self Care | Payer: Managed Care, Other (non HMO) | Attending: Urgent Care | Admitting: Urgent Care

## 2021-06-17 ENCOUNTER — Other Ambulatory Visit: Payer: Self-pay

## 2021-06-17 ENCOUNTER — Ambulatory Visit (INDEPENDENT_AMBULATORY_CARE_PROVIDER_SITE_OTHER): Payer: Managed Care, Other (non HMO)

## 2021-06-17 DIAGNOSIS — R059 Cough, unspecified: Secondary | ICD-10-CM

## 2021-06-17 DIAGNOSIS — R0602 Shortness of breath: Secondary | ICD-10-CM

## 2021-06-17 DIAGNOSIS — M940 Chondrocostal junction syndrome [Tietze]: Secondary | ICD-10-CM

## 2021-06-17 MED ORDER — PREDNISONE 10 MG (21) PO TBPK: ORAL_TABLET | Freq: Every day | ORAL | 0 refills | Status: DC

## 2021-06-17 NOTE — Discharge Instructions (Addendum)
Your chest pain is most likely costochondritis, which is inflammation of the cartilage around the sternum. Please start taking the prednisone as prescribed. It is best taken in the morning to help prevent insomnia. You can also purchase a lidocaine patch over the counter (4% patch by [employer]). This may also help with the pain. Please call your PCP in the morning to schedule a follow up in 1 week. If ANY worsening, please head to the ER

## 2021-06-17 NOTE — ED Provider Notes (Addendum)
[facility]    CSN: [id number] Arrival date & time: 06/17/21  1520      History   Chief Complaint Chief Complaint  Patient presents with   Chest Pain   Shortness of Breath    HPI Sandra Boyer is a 41 y.o. female.   Pleasant 41 year old female presents today with a 3-week history of chest pain.  She states is primarily midline/left, but does go to the right as well.  She states it is a stabbing pain which is worse with movement.  She states sitting down seems to make the pain worse, and standing seems to improve the pain.  She states she feels short of breath as well, walking up a flight of stairs or to car makes her feel tight.  She does have a history of asthma, but states she has been taking her medications as prescribed.  She denies any cough.  She denies any rash to the chest.  States touching her chest increases the pain significantly.  She has not tried any over-the-counter medications.  She states she thought at first it was reflux, or bloating as she has a history of this.  She has been taking her PPI as prescribed.  She denies any abdominal symptoms.  She does not smoke.  She has no history of cardiac disease.  She has no history of hypertension.  She is on birth control, but denies a history of blood clots. She works third shift at [employer] and has been able to do her normal duties, just got off work last night. Denies heavy lifting.   Chest Pain Associated symptoms: shortness of breath   Shortness of Breath Associated symptoms: chest pain    Past Medical History:  Diagnosis Date   Asthma    allergy and seasonal   Bunion of right foot    painful   Depression    Genital herpes    GERD (gastroesophageal reflux disease)    Headache    migraines-coffee induced/ none recently   PONV (postoperative nausea and vomiting)    Seizure (HCC)    stress related/ none since 2012    There are no problems to display for this patient.   Past Surgical History:   Procedure Laterality Date   CERVICAL BIOPSY  W/ LOOP ELECTRODE EXCISION  2009   ESOPHAGOGASTRODUODENOSCOPY  2015   FOOT SURGERY Right    HALLUX VALGUS AUSTIN Right 01/29/2020   Procedure: HALLUX VALGUS AUSTIN RIGHT;  Surgeon: Rosetta PosnerBaker, Andrew, DPM;  [facility];  Service: Podiatry;  Laterality: Right;   HERNIA REPAIR  2017   umbilical   LAPAROSCOPIC OVARIAN CYSTECTOMY  2013   TUBAL LIGATION  2007   laparoscopic    OB History   No obstetric history on file.      Home Medications    Prior to Admission medications   Medication Sig Start Date End Date Taking? Authorizing Provider  predniSONE (STERAPRED UNI-PAK 21 TAB) 10 MG (21) TBPK tablet Take by mouth daily. Take 6 tabs by mouth daily  for 1 days, then 5 tabs for 1 days, then 4 tabs for 1 days, then 3 tabs for 1 days, 2 tabs for 1 days, then 1 tab by mouth daily for 1 days 06/17/21  Yes Zekiah Caruth L, PA  albuterol (VENTOLIN HFA) 108 (90 Base) MCG/ACT inhaler Inhale 2 puffs into the lungs every 6 (six) hours as needed for wheezing or shortness of breath.    [provider]  Azelastine HCl  137 MCG/SPRAY SOLN Place into the nose.    [provider]  Cetirizine HCl 10 MG CAPS Take 1 capsule (10 mg total) by mouth daily for 10 days. 09/30/17 01/29/20  Wieters, Hallie C, PA-C  fluticasone (FLONASE) 50 MCG/ACT nasal spray Place 1-2 sprays into both nostrils daily for 7 days. 09/30/17 01/29/20  Wieters, Hallie C, PA-C  levonorgestrel (PLAN B 1-STEP) 1.5 MG tablet Take 1.5 mg by mouth once.    [provider]  Multiple Vitamins-Calcium (ONE-A-DAY WOMENS FORMULA PO) Take by mouth.    [provider]  omeprazole (PRILOSEC) 20 MG capsule Take 20 mg by mouth daily.    [provider]    Family History Family History  Problem Relation Age of Onset   Hypertension Mother    Hypertension Father    Asthma Father    Asthma Brother    Anxiety disorder Daughter    Depression Daughter     Breast cancer Maternal Aunt     Social History Social History   Tobacco Use   Smoking status: Never   Smokeless tobacco: Never  Substance Use Topics   Alcohol use: Not Currently   Drug use: Never     Allergies   Carbamazepine, Iodinated contrast media, Mangifera indica, Oxcarbazepine, and Phenytoin   Review of Systems Review of Systems  Respiratory:  Positive for shortness of breath.   Cardiovascular:  Positive for chest pain.  All other systems reviewed and are negative.   Physical Exam Triage Vital Signs ED Triage Vitals  Enc Vitals Group     BP 06/17/21 1531 124/73     Pulse Rate 06/17/21 1531 98     Resp 06/17/21 1531 18     Temp --      Temp src --      SpO2 06/17/21 1531 96 %     Weight 06/17/21 1530 173 lb 1 oz (78.5 kg)     Height 06/17/21 1530 5\' 7"  (1.702 m)     Head Circumference --      Peak Flow --      Pain Score 06/17/21 1530 9     Pain Loc --      Pain Edu? --      Excl. in GC? --    No data found.  Updated Vital Signs BP 124/73 (BP Location: Left Arm)    Pulse 98    Resp 18    Ht 5\' 7"  (1.702 m)    Wt 173 lb 1 oz (78.5 kg)    SpO2 96%    BMI 27.11 kg/m   Visual Acuity Right Eye Distance:   Left Eye Distance:   Bilateral Distance:    Right Eye Near:   Left Eye Near:    Bilateral Near:     Physical Exam Vitals and nursing note reviewed.  Constitutional:      General: She is not in acute distress.    Appearance: She is well-developed and normal weight. She is not ill-appearing, toxic-appearing or diaphoretic.  HENT:     Head: Normocephalic and atraumatic.  Eyes:     Extraocular Movements: Extraocular movements intact.     Pupils: Pupils are equal, round, and reactive to light.  Neck:     Thyroid: No thyromegaly.  Cardiovascular:     Rate and Rhythm: Normal rate and regular rhythm. No extrasystoles are present.    Chest Wall: PMI is not displaced.     Heart sounds: Normal heart sounds. Heart sounds not distant.  No murmur  heard. No systolic murmur is present.  No diastolic murmur is present.  Pulmonary:     Effort: Pulmonary effort is normal. No tachypnea, accessory muscle usage or respiratory distress.     Breath sounds: Normal breath sounds. No stridor.  Chest:     Chest wall: Tenderness (very reproducible chest pain to palpation on the L sternal border) present. No mass, deformity, crepitus or edema. There is no dullness to percussion.  Abdominal:     General: Bowel sounds are normal. There is no abdominal bruit.     Palpations: Abdomen is soft. There is no hepatomegaly or splenomegaly.     Tenderness: There is no abdominal tenderness. There is no guarding.  Musculoskeletal:        General: Normal range of motion.     Cervical back: Normal range of motion and neck supple.     Right lower leg: No tenderness. No edema.     Left lower leg: No tenderness. No edema.  Lymphadenopathy:     Cervical: No cervical adenopathy.  Skin:    General: Skin is warm.     Capillary Refill: Capillary refill takes less than 2 seconds.     Coloration: Skin is not cyanotic or pale.     Findings: No ecchymosis, erythema or rash.     Nails: There is no clubbing.  Neurological:     General: No focal deficit present.     Mental Status: She is alert and oriented to person, place, and time.     Cranial Nerves: No cranial nerve deficit.     Motor: No weakness.  Psychiatric:        Mood and Affect: Mood normal.        Behavior: Behavior normal.     UC Treatments / Results  Labs (all labs ordered are listed, but only abnormal results are displayed) Labs Reviewed - No data to display  EKG Normal sinus rhythm, Rate 64 bpm.  PR interval QRS duration 92 ms P-R-T axes  9   33   10  Radiology DG Chest 2 View  Result Date: 06/17/2021 CLINICAL DATA:  Shortness of breath, chest pain EXAM: CHEST - 2 VIEW COMPARISON:  None. FINDINGS: The heart size and mediastinal contours are within normal limits. Both lungs are  clear. The visualized skeletal structures are unremarkable. IMPRESSION: No active cardiopulmonary disease. Electronically Signed   By: Ernie Avena M.D.   On: 06/17/2021 17:05    Procedures Procedures (including critical care time)  Medications Ordered in UC Medications - No data to display  Initial Impression / Assessment and Plan / UC Course  I have reviewed the triage vital signs and the nursing notes.  Pertinent labs & imaging results that were available during my care of the patient were reviewed by me and considered in my medical decision making (see chart for details).     Costochondritis - EKG unremarkable, vital signs stable. HR rechecked and was 78. CXR negative in office. Pts only risk factor for PE is nuvaring, however normal O2 and pulse. Discussed with pt possibly obtaining D-dimer to be extra cautious and that any positive would require ER eval. VERY low index of suspicion and pt decline labs. Reproducible pain to palpation likely due to costochondritis. Supportive measures with heating packs discussed. Will do short course of steroids, pt to avoid OTC NSAIDs concurrently. F/U with PCP in one week, ER for any persistent symptoms.  Final Clinical Impressions(s) / UC Diagnoses  Final diagnoses:  Costochondritis     Discharge Instructions      Your chest pain is most likely costochondritis, which is inflammation of the cartilage around the sternum. Please start taking the prednisone as prescribed. It is best taken in the morning to help prevent insomnia. You can also purchase a lidocaine patch over the counter (4% patch by [employer]). This may also help with the pain. Please call your PCP in the morning to schedule a follow up in 1 week. If ANY worsening, please head to the ER     ED Prescriptions     Medication Sig Dispense Auth. Provider   predniSONE (STERAPRED UNI-PAK 21 TAB) 10 MG (21) TBPK tablet Take by mouth daily. Take 6 tabs by mouth daily  for 1  days, then 5 tabs for 1 days, then 4 tabs for 1 days, then 3 tabs for 1 days, 2 tabs for 1 days, then 1 tab by mouth daily for 1 days 21 tablet Jahzion Brogden L, PA      PDMP not reviewed this encounter.   Maretta Bees, [place] 06/17/21 1912    Maretta Bees, [place] 06/17/21 1913

## 2021-06-17 NOTE — ED Triage Notes (Signed)
Pt reports chest pain and SOB x 3 days. Pt states pain increases with movement. Describes chest pain as sharp.

## 2021-08-20 ENCOUNTER — Other Ambulatory Visit: Payer: Self-pay | Admitting: Orthopedic Surgery

## 2021-08-20 DIAGNOSIS — G8929 Other chronic pain: Secondary | ICD-10-CM

## 2021-08-25 ENCOUNTER — Emergency Department
Admission: EM | Admit: 2021-08-25 | Discharge: 2021-08-25 | Disposition: A | Discharge status: Home / Self Care | Payer: Managed Care, Other (non HMO) | Attending: Emergency Medicine | Admitting: Emergency Medicine

## 2021-08-25 DIAGNOSIS — M25561 Pain in right knee: Secondary | ICD-10-CM | POA: Diagnosis present

## 2021-08-25 DIAGNOSIS — M25461 Effusion, right knee: Secondary | ICD-10-CM | POA: Diagnosis not present

## 2021-08-25 DIAGNOSIS — G8929 Other chronic pain: Secondary | ICD-10-CM | POA: Insufficient documentation

## 2021-08-25 MED ORDER — HYDROCODONE-ACETAMINOPHEN 5-325 MG PO TABS: 2.0000 | ORAL_TABLET | Freq: Four times a day (QID) | ORAL | 0 refills | Status: DC | PRN

## 2021-08-25 NOTE — Discharge Instructions (Signed)
Continue using your knee brace and trying the RICE method of symptom control.  Follow-up with Dr. Allena Katz or go to the orthopedics walk-in clinic if you cannot wait for a scheduled appointment with Dr. Allena Katz.  He said he would verify that someone will call you for scheduling the outpatient MRI. ? ?Try to only use over-the-counter pain medication according to label instructions.  Take Norco as prescribed for severe pain. Do not drink alcohol, drive or participate in any other potentially dangerous activities while taking this medication as it may make you sleepy. Do not take this medication with any other sedating medications, either prescription or over-the-counter. If you were prescribed Percocet or Vicodin, do not take these with acetaminophen (Tylenol) as it is already contained within these medications. ?  ?This medication is an opiate (or narcotic) pain medication and can be habit forming.  Use it as little as possible to achieve adequate pain control.  Do not use or use it with extreme caution if you have a history of opiate abuse or dependence.  If you are on a pain contract with your primary care doctor or a pain specialist, be sure to let them know you were prescribed this medication today from the [facility] Emergency Department.  This medication is intended for your use only - do not give any to anyone else and keep it in a secure place where nobody else, especially children, have access to it.  It will also cause or worsen constipation, so you may want to consider taking an over-the-counter stool softener while you are taking this medication. ? ?

## 2021-08-25 NOTE — ED Triage Notes (Signed)
Pt presents via POV with complaints of right knee pain - Pt was seen by her orthopedic today and was scheduled for an MRI but she has had an increase in pain and swelling tonight. At home she has tried the RICE method without any improvement. Denies CP or SOB.  ?

## 2021-08-25 NOTE — ED Provider Notes (Signed)
? ?[facility] ?Provider Note ? ? ? Event Date/Time  ? First MD Initiated Contact with Patient 08/25/21 [phone]   ?  (approximate) ? ? ?History  ? ?Knee Pain ? ? ?HPI ? ?Sandra Boyer is a 41 y.o. female with a history notable for chronic right knee pain for which she sees Dr. Signa Kell.  She presents this morning for evaluation of worsening pain and some swelling.  She says she has not had fluid on the knee before but she seems to have some now.  She has been trying RICE and the knee brace that she was provided and is not helping.  She reports that she was told she would be contacted by Dr. Eliane Decree office for an outpatient MRI when she saw him last week, but no one has reached out to her yet. ? ?No fever, no recent injections or injuries to the knee, and she is ambulatory with the use of the knee brace. ?  ? ? ?Physical Exam  ? ?Triage Vital Signs: ?ED Triage Vitals  ?Enc Vitals Group  ?   BP 08/25/21 0603 (!) 143/106  ?   Pulse Rate 08/25/21 0603 65  ?   Resp 08/25/21 0603 20  ?   Temp 08/25/21 0603 98.2 ?F (36.8 ?C)  ?   Temp Source 08/25/21 0603 Oral  ?   SpO2 08/25/21 0603 100 %  ?   Weight 08/25/21 0605 80.7 kg (178 lb)  ?   Height 08/25/21 0605 1.702 m (5\' 7" )  ?   Head Circumference --   ?   Peak Flow --   ?   Pain Score 08/25/21 0605 10  ?   Pain Loc --   ?   Pain Edu? --   ?   Excl. in GC? --   ? ? ?Most recent vital signs: ?Vitals:  ? 08/25/21 0603  ?BP: (!) 143/106  ?Pulse: 65  ?Resp: 20  ?Temp: 98.2 ?F (36.8 ?C)  ?SpO2: 100%  ? ? ? ?General: Awake, no distress.  ?CV:  Good peripheral perfusion.  ?Resp:  Normal effort.  ?Other:  I had the patient remove her knee brace so that I could examine her.  She has a relatively small infrapatellar effusion that is tender to palpation.  No erythema, no streaking to suggest cellulitis.  She is able to flex and extend her knee with some discomfort but without significant difficulty.  With the brace in place, she is able to ambulate almost without  a limp. ? ? ?ED Results / Procedures / Treatments  ? ?Labs ?(all labs ordered are listed, but only abnormal results are displayed) ?Labs Reviewed - No data to display ? ? ?PROCEDURES: ? ?Critical Care performed: No ? ?Procedures ? ? ?MEDICATIONS ORDERED IN ED: ?Medications - No data to display ? ? ?IMPRESSION / MDM / ASSESSMENT AND PLAN / ED COURSE  ?I reviewed the triage vital signs and the nursing notes. ?             ?               ? ?Differential diagnosis includes, but is not limited to, chronic knee pain, inflammatory effusion, septic arthritis, bursitis. ? ?Patient has had issues with her right knee for at least 3 years (I saw in the system that the patient had an MRI of the right knee in 2020).  Her vital signs are stable and within normal limits.  Her physical exam is reassuring with  a small effusion.  Of note, I reviewed the note from Dr. Signa Kell from Ortho clinic from 08/19/2021, and he mentioned that the patient told him at that time that she had been having some swelling and fluid on the knee, even though she told me it was a new issue. ? ?Fortunately there is no evidence of an acute or emergent issue and this seems to be an exacerbation of her chronic knee problems.  I called and consulted by phone with Dr. Allena Katz.  He agreed that there is no need to engage in any new imaging or testing in the emergency department.  He will follow-up and make sure that his clinic reaches out to her about scheduling an MRI.  I also reinforced to her that they have walk-in hours at the orthopedics clinic if they would like to be seen by specialist.  I gave her a short course of Norco to help with the acute on chronic pain.  I gave my usual and customary follow-up recommendations and return precautions. ? ? ? ? ?  ? ? ?FINAL CLINICAL IMPRESSION(S) / ED DIAGNOSES  ? ?Final diagnoses:  ?Chronic pain of right knee  ?Knee effusion, right  ? ? ? ?Rx / DC Orders  ? ?ED Discharge Orders   ? ?      Ordered  ?   HYDROcodone-acetaminophen (NORCO/VICODIN) 5-325 MG tablet  Every 6 hours PRN       ? 08/25/21 0640  ? ?  ?  ? ?  ? ? ? ?Note:  This document was prepared using Dragon voice recognition software and may include unintentional dictation errors. ?  ?Loleta Rose, MD ?08/25/21 [phone] ? ?

## 2021-08-29 ENCOUNTER — Ambulatory Visit
Admission: RE | Admit: 2021-08-29 | Discharge: 2021-08-29 | Disposition: A | Discharge status: Home / Self Care | Payer: Managed Care, Other (non HMO) | Source: Ambulatory Visit | Attending: Orthopedic Surgery | Admitting: Orthopedic Surgery

## 2021-08-29 DIAGNOSIS — G8929 Other chronic pain: Secondary | ICD-10-CM

## 2021-08-30 ENCOUNTER — Other Ambulatory Visit: Payer: Managed Care, Other (non HMO)

## 2021-09-23 ENCOUNTER — Other Ambulatory Visit
Admission: RE | Admit: 2021-09-23 | Discharge: 2021-09-23 | Disposition: A | Discharge status: Home / Self Care | Payer: Managed Care, Other (non HMO) | Source: Ambulatory Visit | Attending: Orthopedic Surgery | Admitting: Orthopedic Surgery

## 2021-09-23 DIAGNOSIS — G8929 Other chronic pain: Secondary | ICD-10-CM | POA: Diagnosis present

## 2021-09-23 DIAGNOSIS — M25561 Pain in right knee: Secondary | ICD-10-CM | POA: Diagnosis present

## 2021-09-23 LAB — SYNOVIAL FLUID, CRYSTAL: Crystals, Fluid: NONE SEEN

## 2021-12-18 ENCOUNTER — Emergency Department
Admission: EM | Admit: 2021-12-18 | Discharge: 2021-12-18 | Disposition: A | Discharge status: Home / Self Care | Payer: Managed Care, Other (non HMO) | Attending: Emergency Medicine | Admitting: Emergency Medicine

## 2021-12-18 ENCOUNTER — Other Ambulatory Visit: Payer: Self-pay

## 2021-12-18 ENCOUNTER — Encounter: Payer: Self-pay | Admitting: Emergency Medicine

## 2021-12-18 DIAGNOSIS — W57XXXA Bitten or stung by nonvenomous insect and other nonvenomous arthropods, initial encounter: Secondary | ICD-10-CM | POA: Insufficient documentation

## 2021-12-18 DIAGNOSIS — S80861A Insect bite (nonvenomous), right lower leg, initial encounter: Secondary | ICD-10-CM | POA: Diagnosis present

## 2021-12-18 MED ORDER — PREDNISONE 10 MG (21) PO TBPK: ORAL_TABLET | ORAL | 0 refills | Status: DC

## 2021-12-18 NOTE — Discharge Instructions (Signed)
Follow up with your regular doctor if not improving in 3 days Take the medication as prescribed Use otc hydrocortisone cream Benadryl as needed

## 2021-12-18 NOTE — ED Provider Notes (Signed)
[facility] Provider Note    Event Date/Time   First MD Initiated Contact with Patient 12/18/21 [phone]     (approximate)   History   Insect Bite   HPI  Sandra Boyer is a 41 y.o. female presents emergency department complaining of bug bites and swelling on the right lower leg.  States areas been very itchy.  More swollen than normal.  Symptoms started several days ago.  No chest pain or shortness of breath      Physical Exam   Triage Vital Signs: ED Triage Vitals  Enc Vitals Group     BP 12/18/21 0741 (!) 127/98     Pulse Rate 12/18/21 0741 70     Resp 12/18/21 0741 18     Temp 12/18/21 0741 98.2 F (36.8 C)     Temp Source 12/18/21 0741 Oral     SpO2 12/18/21 0741 100 %     Weight 12/18/21 0748 177 lb 14.6 oz (80.7 kg)     Height 12/18/21 0748 5\' 7"  (1.702 m)     Head Circumference --      Peak Flow --      Pain Score 12/18/21 0741 0     Pain Loc --      Pain Edu? --      Excl. in GC? --     Most recent vital signs: Vitals:   12/18/21 0741  BP: (!) 127/98  Pulse: 70  Resp: 18  Temp: 98.2 F (36.8 C)  SpO2: 100%     General: Awake, no distress.   CV:  Good peripheral perfusion. regular rate and  rhythm Resp:  Normal effort. Lungs CTA Abd:  No distention.   Other:  Skin on right lower extremity has 3 or 4 bites along the lateral aspect, 2 on the inside, swelling noted at the prepatellar area of the right knee which is chronic.  Neurovascular is intact, negative Homans' sign   ED Results / Procedures / Treatments   Labs (all labs ordered are listed, but only abnormal results are displayed) Labs Reviewed - No data to display   EKG     RADIOLOGY     PROCEDURES:   Procedures   MEDICATIONS ORDERED IN ED: Medications - No data to display   IMPRESSION / MDM / ASSESSMENT AND PLAN / ED COURSE  I reviewed the triage vital signs and the nursing notes.                              Differential diagnosis includes,  but is not limited to, insect bite, cellulitis, allergic reaction  Patient's presentation is most consistent with acute, uncomplicated illness.   Area on the right lower extremity is only tender along the bug bites, no drainage or pus noted We will start the patient on a steroid pack because she is complaining of itching and pain.  He is to follow-up with her regular doctor if not improving 3 days.  Return if worsening.  Discharged stable condition.     FINAL CLINICAL IMPRESSION(S) / ED DIAGNOSES   Final diagnoses:  Insect bite of right lower extremity, initial encounter     Rx / DC Orders   ED Discharge Orders          Ordered    predniSONE (STERAPRED UNI-PAK 21 TAB) 10 MG (21) TBPK tablet        12/18/21 12/20/21  Note:  This document was prepared using Dragon voice recognition software and may include unintentional dictation errors.    Faythe Ghee, PA-C 12/18/21 0964    Chesley Noon, MD 12/18/21 [phone]

## 2021-12-18 NOTE — ED Notes (Signed)
Pt has several small reddened spots on her R lower leg. Pt stated she is swollen more than normal. Pt has adequate rom and pms functions intact. Pt stated this started several days ago, and she had taken benadryl.

## 2021-12-18 NOTE — ED Triage Notes (Signed)
Pt presents to ED with c/o of "bites to R lower leg area. Pt is unsure of what bit her. Pt does have multiple bites to that area. Pt states s/s started a few days ago.   Pt denies fevers or chills.

## 2022-01-13 ENCOUNTER — Other Ambulatory Visit: Payer: Self-pay | Admitting: Student in an Organized Health Care Education/Training Program

## 2022-01-13 DIAGNOSIS — G8929 Other chronic pain: Secondary | ICD-10-CM

## 2022-02-14 DIAGNOSIS — K029 Dental caries, unspecified: Secondary | ICD-10-CM | POA: Diagnosis not present

## 2022-02-14 DIAGNOSIS — Z7951 Long term (current) use of inhaled steroids: Secondary | ICD-10-CM | POA: Insufficient documentation

## 2022-02-14 DIAGNOSIS — J45909 Unspecified asthma, uncomplicated: Secondary | ICD-10-CM | POA: Diagnosis not present

## 2022-02-14 DIAGNOSIS — K0889 Other specified disorders of teeth and supporting structures: Secondary | ICD-10-CM | POA: Diagnosis present

## 2022-02-15 ENCOUNTER — Emergency Department
Admission: EM | Admit: 2022-02-15 | Discharge: 2022-02-15 | Disposition: A | Discharge status: Home / Self Care | Payer: Managed Care, Other (non HMO) | Attending: Emergency Medicine | Admitting: Emergency Medicine

## 2022-02-15 ENCOUNTER — Other Ambulatory Visit: Payer: Self-pay

## 2022-02-15 DIAGNOSIS — K029 Dental caries, unspecified: Secondary | ICD-10-CM

## 2022-02-15 MED ORDER — IBUPROFEN 800 MG PO TABS: 800.0000 mg | ORAL_TABLET | Freq: Three times a day (TID) | ORAL | 0 refills | Status: AC | PRN

## 2022-02-15 MED ORDER — AMOXICILLIN 500 MG PO CAPS
500.0000 mg | ORAL_CAPSULE | Freq: Once | ORAL | Status: AC
  Administered 2022-02-15: 500 mg via ORAL
  Filled 2022-02-15: qty 1

## 2022-02-15 MED ORDER — ONDANSETRON 4 MG PO TBDP: 4.0000 mg | ORAL_TABLET | Freq: Four times a day (QID) | ORAL | 0 refills | Status: AC | PRN

## 2022-02-15 MED ORDER — OXYCODONE-ACETAMINOPHEN 5-325 MG PO TABS
1.0000 | ORAL_TABLET | Freq: Once | ORAL | Status: AC
  Administered 2022-02-15: 1 via ORAL
  Filled 2022-02-15: qty 1

## 2022-02-15 MED ORDER — OXYCODONE-ACETAMINOPHEN 5-325 MG PO TABS: 1.0000 | ORAL_TABLET | Freq: Four times a day (QID) | ORAL | 0 refills | Status: DC | PRN

## 2022-02-15 MED ORDER — ONDANSETRON 4 MG PO TBDP
4.0000 mg | ORAL_TABLET | Freq: Once | ORAL | Status: AC
  Administered 2022-02-15: 4 mg via ORAL
  Filled 2022-02-15: qty 1

## 2022-02-15 MED ORDER — AMOXICILLIN 500 MG PO CAPS: 500.0000 mg | ORAL_CAPSULE | Freq: Three times a day (TID) | ORAL | 0 refills | Status: DC

## 2022-02-15 MED ORDER — IBUPROFEN 800 MG PO TABS
800.0000 mg | ORAL_TABLET | Freq: Once | ORAL | Status: AC
  Administered 2022-02-15: 800 mg via ORAL
  Filled 2022-02-15: qty 1

## 2022-02-15 NOTE — ED Triage Notes (Signed)
Pt complains of left upper jaw pain for several days. Pt had to be woken up for triage. Pt with decayed tooth noted to left upper jaw. Pt denies known fever.

## 2022-02-15 NOTE — Discharge Instructions (Addendum)

## 2022-02-15 NOTE — ED Provider Notes (Signed)
[facility] Provider Note    Event Date/Time   First MD Initiated Contact with Patient 02/15/22 [phone]     (approximate)   History   Jaw Pain   HPI  Sandra Boyer is a 41 y.o. female with history of asthma, seizures who presents to the emergency department with complaints of 3 days of left upper dental pain.  Does not have a dentist.  No facial swelling, fever.  Has not been on antibiotics.   History provided by patient.    Past Medical History:  Diagnosis Date   Asthma    allergy and seasonal   Bunion of right foot    painful   Depression    Genital herpes    GERD (gastroesophageal reflux disease)    Headache    migraines-coffee induced/ none recently   PONV (postoperative nausea and vomiting)    Seizure (HCC)    stress related/ none since 2012    Past Surgical History:  Procedure Laterality Date   CERVICAL BIOPSY  W/ LOOP ELECTRODE EXCISION  2009   ESOPHAGOGASTRODUODENOSCOPY  2015   FOOT SURGERY Right    HALLUX VALGUS AUSTIN Right 01/29/2020   Procedure: HALLUX VALGUS AUSTIN RIGHT;  Surgeon: Rosetta Posner, DPM;  [facility];  Service: Podiatry;  Laterality: Right;   HERNIA REPAIR  2017   umbilical   LAPAROSCOPIC OVARIAN CYSTECTOMY  2013   TUBAL LIGATION  2007   laparoscopic    MEDICATIONS:  Prior to Admission medications   Medication Sig Start Date End Date Taking? Authorizing Provider  amoxicillin (AMOXIL) 500 MG capsule Take 1 capsule (500 mg total) by mouth 3 (three) times daily. 02/15/22  Yes Duru Reiger N, DO  ibuprofen (ADVIL) 800 MG tablet Take 1 tablet (800 mg total) by mouth every 8 (eight) hours as needed. 02/15/22  Yes Mari Battaglia N, DO  ondansetron (ZOFRAN-ODT) 4 MG disintegrating tablet Take 1 tablet (4 mg total) by mouth every 6 (six) hours as needed for nausea or vomiting. 02/15/22  Yes Crystal Ellwood, Layla Maw, DO  oxyCODONE-acetaminophen (PERCOCET) 5-325 MG tablet Take 1 tablet by mouth every 6 (six) hours  as needed for severe pain. 02/15/22 02/15/23 Yes Naome Brigandi N, DO  albuterol (VENTOLIN HFA) 108 (90 Base) MCG/ACT inhaler Inhale 2 puffs into the lungs every 6 (six) hours as needed for wheezing or shortness of breath.    [provider]  Azelastine HCl 137 MCG/SPRAY SOLN Place into the nose.    [provider]  Cetirizine HCl 10 MG CAPS Take 1 capsule (10 mg total) by mouth daily for 10 days. 09/30/17 01/29/20  Wieters, Hallie C, PA-C  fluticasone (FLONASE) 50 MCG/ACT nasal spray Place 1-2 sprays into both nostrils daily for 7 days. 09/30/17 01/29/20  Wieters, Hallie C, PA-C  levonorgestrel (PLAN B 1-STEP) 1.5 MG tablet Take 1.5 mg by mouth once.    [provider]  Multiple Vitamins-Calcium (ONE-A-DAY WOMENS FORMULA PO) Take by mouth.    [provider]  omeprazole (PRILOSEC) 20 MG capsule Take 20 mg by mouth daily.    [provider]  predniSONE (STERAPRED UNI-PAK 21 TAB) 10 MG (21) TBPK tablet Take 6 pills on day one then decrease by 1 pill each day 12/18/21   Faythe Ghee, PA-C    Physical Exam   Triage Vital Signs: ED Triage Vitals  Enc Vitals Group     BP 02/15/22 0033 126/83     Pulse Rate 02/15/22 0033 64  Resp 02/15/22 0033 16     Temp 02/15/22 0033 98.6 F (37 C)     Temp Source 02/15/22 0033 Oral     SpO2 02/15/22 0033 100 %     Weight 02/15/22 0034 185 lb (83.9 kg)     Height 02/15/22 0034 5\' 7"  (1.702 m)     Head Circumference --      Peak Flow --      Pain Score 02/15/22 0033 10     Pain Loc --      Pain Edu? --      Excl. in [place]? --     Most recent vital signs: Vitals:   02/15/22 0033  BP: 126/83  Pulse: 64  Resp: 16  Temp: 98.6 F (37 C)  SpO2: 100%    CONSTITUTIONAL: Alert and oriented and responds appropriately to questions. Well-appearing; well-nourished HEAD: Normocephalic, atraumatic EYES: Conjunctivae clear, pupils appear equal, sclera nonicteric ENT: normal nose; moist mucous membranes; No  pharyngeal erythema or petechiae, no tonsillar hypertrophy or exudate, no uvular deviation, no unilateral swelling in posterior oropharynx, no trismus or drooling, no muffled voice, normal phonation, no stridor, airway patent.  Dental caries present, no drainable dental abscess noted, gums appear normal without bleeding, no Ludwig's angina, tongue sits flat in the bottom of the mouth, no angioedema, no facial erythema or warmth, no facial swelling, moves neck without difficulty, normal phonation without stridor, trismus or drooling. NECK: Supple, normal ROM CARD: RRR; S1 and S2 appreciated; no murmurs, no clicks, no rubs, no gallops RESP: Normal chest excursion without splinting or tachypnea; breath sounds clear and equal bilaterally; no wheezes, no rhonchi, no rales, no hypoxia or respiratory distress, speaking full sentences ABD/GI: Normal bowel sounds; non-distended; soft, non-tender, no rebound, no guarding, no peritoneal signs BACK: The back appears normal EXT: Normal ROM in all joints; no deformity noted, no edema; no cyanosis SKIN: Normal color for age and race; warm; no rash on exposed skin NEURO: Moves all extremities equally, normal speech PSYCH: The patient's mood and manner are appropriate.   ED Results / Procedures / Treatments   LABS: (all labs ordered are listed, but only abnormal results are displayed) Labs Reviewed - No data to display   EKG:  RADIOLOGY: My personal review and interpretation of imaging:    I have personally reviewed all radiology reports.   No results found.   PROCEDURES:  Critical Care performed: No    Procedures    IMPRESSION / MDM / ASSESSMENT AND PLAN / ED COURSE  I reviewed the triage vital signs and the nursing notes.    Patient here with dental caries causing dental pain.   DIFFERENTIAL DIAGNOSIS (includes but not limited to):   Dental caries, no sign of drainable dental abscess but patient could have underlying periapical  abscess, no facial cellulitis, no signs of peritonsillar abscess, pharyngitis, deep space neck infection, Ludwigs   Patient's presentation is most consistent with acute presentation with potential threat to life or bodily function.   PLAN: We will discharge with antibiotics and pain medication.  We will give outpatient dental follow-up information.  She has no airway involvement and does not appear toxic, septic.  No trismus or drooling.  Normal phonation.  No Ludwig's angina.   MEDICATIONS GIVEN IN ED: Medications  oxyCODONE-acetaminophen (PERCOCET/ROXICET) 5-325 MG per tablet 1 tablet (1 tablet Oral Given 02/15/22 0406)  amoxicillin (AMOXIL) capsule 500 mg (500 mg Oral Given 02/15/22 0453)  oxyCODONE-acetaminophen (PERCOCET/ROXICET) 5-325 MG per tablet 1 tablet (  1 tablet Oral Given 02/15/22 0453)  ibuprofen (ADVIL) tablet 800 mg (800 mg Oral Given 02/15/22 0452)  ondansetron (ZOFRAN-ODT) disintegrating tablet 4 mg (4 mg Oral Given 02/15/22 0453)     ED COURSE:  At this time, I do not feel there is any life-threatening condition present. I reviewed all nursing notes, vitals, pertinent previous records.  All lab and urine results, EKGs, imaging ordered have been independently reviewed and interpreted by myself.  I reviewed all available radiology reports from any imaging ordered this visit.  Based on my assessment, I feel the patient is safe to be discharged home without further emergent workup and can continue workup as an outpatient as needed. Discussed all findings, treatment plan as well as usual and customary return precautions.  They verbalize understanding and are comfortable with this plan.  Outpatient follow-up has been provided as needed.  All questions have been answered.    CONSULTS: No emergent dental consult needed at this time.  Patient is appropriate for outpatient management.   OUTSIDE RECORDS REVIEWED: Reviewed patient's last PCP note on 01/08/2022.       FINAL  CLINICAL IMPRESSION(S) / ED DIAGNOSES   Final diagnoses:  Pain due to dental caries     Rx / DC Orders   ED Discharge Orders          Ordered    amoxicillin (AMOXIL) 500 MG capsule  3 times daily        02/15/22 0445    ibuprofen (ADVIL) 800 MG tablet  Every 8 hours PRN        02/15/22 0445    oxyCODONE-acetaminophen (PERCOCET) 5-325 MG tablet  Every 6 hours PRN        02/15/22 0445    ondansetron (ZOFRAN-ODT) 4 MG disintegrating tablet  Every 6 hours PRN        02/15/22 0445             Note:  This document was prepared using Dragon voice recognition software and may include unintentional dictation errors.   Saul Dorsi, Layla Maw, DO 02/15/22 0502

## 2022-03-18 ENCOUNTER — Ambulatory Visit: Payer: Managed Care, Other (non HMO) | Admitting: Student in an Organized Health Care Education/Training Program

## 2022-03-27 ENCOUNTER — Emergency Department
Admission: EM | Admit: 2022-03-27 | Discharge: 2022-03-27 | Disposition: A | Discharge status: Home / Self Care | Payer: Managed Care, Other (non HMO) | Attending: Emergency Medicine | Admitting: Emergency Medicine

## 2022-03-27 ENCOUNTER — Other Ambulatory Visit: Payer: Self-pay

## 2022-03-27 DIAGNOSIS — T7840XA Allergy, unspecified, initial encounter: Secondary | ICD-10-CM | POA: Insufficient documentation

## 2022-03-27 DIAGNOSIS — J45909 Unspecified asthma, uncomplicated: Secondary | ICD-10-CM | POA: Insufficient documentation

## 2022-03-27 DIAGNOSIS — H0289 Other specified disorders of eyelid: Secondary | ICD-10-CM | POA: Diagnosis present

## 2022-03-27 MED ORDER — CETIRIZINE HCL 10 MG PO TABS: 10.0000 mg | ORAL_TABLET | Freq: Two times a day (BID) | ORAL | 0 refills | Status: AC

## 2022-03-27 NOTE — Discharge Instructions (Signed)
   Thank you for choosing us for your health care today!  Please see your primary doctor this week for a follow up appointment.   If you do not have a primary doctor call the following clinics to establish care:  If you have insurance:  [facility] [phone] [address] [place] [address]   [facility]  [phone] [address] [place] [address]   If you do not have insurance:  [facility]  [phone] [address] [place] [address]  Sometimes, in the early stages of certain disease courses it is difficult to detect in the emergency department evaluation -- so, it is important that you continue to monitor your symptoms and call your doctor right away or return to the emergency department if you develop any new or worsening symptoms.  It was my pleasure to care for you today.   Xavious Sharrar S. Issa Kosmicki, MD  

## 2022-03-27 NOTE — ED Provider Triage Note (Signed)
Emergency Medicine Provider Triage Evaluation Note  Sandra Boyer , a 41 y.o. female  was evaluated in triage.  Pt complains of left eye lid swelling since last night. No injury  Review of Systems  Positive: + itching, swelling Negative: No vision changes  Physical Exam  BP 111/70   Pulse 65   Temp 98.3 F (36.8 C)   Resp 16   Ht 5\' 7"  (1.702 m)   Wt 84.4 kg   SpO2 100%   BMI 29.13 kg/m  Gen:   Awake, no distress   Resp:  Normal effort  MSK:   Moves extremities without difficulty  Other:  Left upper eye lid swollen and slightly red.  Medical Decision Making  Medically screening exam initiated at 7:12 AM.  Appropriate orders placed.  Sandra Boyer was informed that the remainder of the evaluation will be completed by another provider, this initial triage assessment does not replace that evaluation, and the importance of remaining in the ED until their evaluation is complete.     Sibyl Parr, PA-C 03/27/22 [phone]

## 2022-03-27 NOTE — ED Triage Notes (Signed)
Pt to ED for waking up with puffiness and redness to left eye. +itching. Denies drainage.

## 2022-03-27 NOTE — ED Notes (Signed)
59 yof with a c/c of left eye swelling and itching since last night around 10:30 pm. The pt denies any injury to the eye.

## 2022-03-27 NOTE — ED Provider Notes (Signed)
   [facility] Provider Note    Event Date/Time   First MD Initiated Contact with Patient 03/27/22 [phone]     (approximate)   History   Eye Problem   HPI  Sandra Boyer is a 41 y.o. female   Past medical history of asthma and allergies who presents with left eyelid swelling and itching when waking this morning.  No preceding symptoms, no known new exposures, no other illnesses or medical complaints.    No visual changes.  Her eye itself is not itchy, just the eyelid.   History was obtained via patient.      Physical Exam   Triage Vital Signs: ED Triage Vitals  Enc Vitals Group     BP 03/27/22 0707 111/70     Pulse Rate 03/27/22 0707 65     Resp 03/27/22 0707 16     Temp 03/27/22 0707 98.3 F (36.8 C)     Temp src --      SpO2 03/27/22 0707 100 %     Weight 03/27/22 0708 186 lb (84.4 kg)     Height 03/27/22 0708 5\' 7"  (1.702 m)     Head Circumference --      Peak Flow --      Pain Score 03/27/22 0710 0     Pain Loc --      Pain Edu? --      Excl. in [place]? --     Most recent vital signs: Vitals:   03/27/22 0707  BP: 111/70  Pulse: 65  Resp: 16  Temp: 98.3 F (36.8 C)  SpO2: 100%    General: Awake, no distress.  CV:  Good peripheral perfusion.  Resp:  Normal effort.  Abd:  No distention.  Other:  Swelling to the left eyelid, some clear small raised vesicular lesions, no erythema or warmth.  Extraocular movements intact.   ED Results / Procedures / Treatments   Labs (all labs ordered are listed, but only abnormal results are displayed) Labs Reviewed - No data to display  PROCEDURES:  Critical Care performed: No  Procedures   MEDICATIONS ORDERED IN ED: Medications - No data to display  IMPRESSION / MDM / ASSESSMENT AND PLAN / ED COURSE  I reviewed the triage vital signs and the nursing notes.                              Differential diagnosis includes, but is not limited to, allergic reaction, contact dermatitis,  considered but less likely conjunctivitis   MDM: Itching and swelling to the eyelid only most likely allergic reaction.  Considered bacterial infection, eye pathology but less likely given exam as above.  Antihistamines and follow-up with PMD return if worsening.   Patient's presentation is most consistent with acute complicated illness / injury requiring diagnostic workup.       FINAL CLINICAL IMPRESSION(S) / ED DIAGNOSES   Final diagnoses:  Allergic reaction, initial encounter     Rx / DC Orders   ED Discharge Orders          Ordered    cetirizine (ZYRTEC ALLERGY) 10 MG tablet  2 times daily        03/27/22 0745             Note:  This document was prepared using Dragon voice recognition software and may include unintentional dictation errors.    03/29/22, MD 03/27/22 [phone]

## 2022-05-20 ENCOUNTER — Encounter: Payer: Self-pay | Admitting: Student in an Organized Health Care Education/Training Program

## 2022-05-20 ENCOUNTER — Ambulatory Visit
Payer: Managed Care, Other (non HMO) | Attending: Student in an Organized Health Care Education/Training Program | Admitting: Student in an Organized Health Care Education/Training Program

## 2022-05-20 VITALS — BP 122/85 | HR 78 | Temp 97.7°F | Resp 16 | Ht 67.0 in | Wt 186.0 lb

## 2022-05-20 DIAGNOSIS — G894 Chronic pain syndrome: Secondary | ICD-10-CM | POA: Insufficient documentation

## 2022-05-20 DIAGNOSIS — M25461 Effusion, right knee: Secondary | ICD-10-CM | POA: Diagnosis not present

## 2022-05-20 DIAGNOSIS — G8929 Other chronic pain: Secondary | ICD-10-CM | POA: Diagnosis not present

## 2022-05-20 DIAGNOSIS — M25561 Pain in right knee: Secondary | ICD-10-CM | POA: Diagnosis not present
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
## 2022-05-20 NOTE — Progress Notes (Signed)
Safety precautions to be maintained throughout the outpatient stay will include: orient to surroundings, keep bed in low position, maintain call bell within reach at all times, provide assistance with transfer out of bed and ambulation.  

## 2022-05-20 NOTE — Patient Instructions (Signed)
____________________________________________________________________________________________  General Risks and Possible Complications  Patient Responsibilities: It is important that you read this as it is part of your informed consent. It is our duty to inform you of the risks and possible complications associated with treatments offered to you. It is your responsibility as a patient to read this and to ask questions about anything that is not clear or that you believe was not covered in this document.  Patient's Rights: You have the right to refuse treatment. You also have the right to change your mind, even after initially having agreed to have the treatment done. However, under this last option, if you wait until the last second to change your mind, you may be charged for the materials used up to that point.  Introduction: Medicine is not an Chief Strategy Officer. Everything in Medicine, including the lack of treatment(s), carries the potential for danger, harm, or loss (which is by definition: Risk). In Medicine, a complication is a secondary problem, condition, or disease that can aggravate an already existing one. All treatments carry the risk of possible complications. The fact that a side effects or complications occurs, does not imply that the treatment was conducted incorrectly. It must be clearly understood that these can happen even when everything is done following the highest safety standards.  No treatment: You can choose not to proceed with the proposed treatment alternative. The "PRO(s)" would include: avoiding the risk of complications associated with the therapy. The "CON(s)" would include: not getting any of the treatment benefits. These benefits fall under one of three categories: diagnostic; therapeutic; and/or palliative. Diagnostic benefits include: getting information which can ultimately lead to improvement of the disease or symptom(s). Therapeutic benefits are those associated with  the successful treatment of the disease. Finally, palliative benefits are those related to the decrease of the primary symptoms, without necessarily curing the condition (example: decreasing the pain from a flare-up of a chronic condition, such as incurable terminal cancer).  General Risks and Complications: These are associated to most interventional treatments. They can occur alone, or in combination. They fall under one of the following six (6) categories: no benefit or worsening of symptoms; bleeding; infection; nerve damage; allergic reactions; and/or death. No benefits or worsening of symptoms: In Medicine there are no guarantees, only probabilities. No healthcare provider can ever guarantee that a medical treatment will work, they can only state the probability that it may. Furthermore, there is always the possibility that the condition may worsen, either directly, or indirectly, as a consequence of the treatment. Bleeding: This is more common if the patient is taking a blood thinner, either prescription or over the counter (example: Goody Powders, Fish oil, Aspirin, Garlic, etc.), or if suffering a condition associated with impaired coagulation (example: Hemophilia, cirrhosis of the liver, low platelet counts, etc.). However, even if you do not have one on these, it can still happen. If you have any of these conditions, or take one of these drugs, make sure to notify your treating physician. Infection: This is more common in patients with a compromised immune system, either due to disease (example: diabetes, cancer, human immunodeficiency virus [HIV], etc.), or due to medications or treatments (example: therapies used to treat cancer and rheumatological diseases). However, even if you do not have one on these, it can still happen. If you have any of these conditions, or take one of these drugs, make sure to notify your treating physician. Nerve Damage: This is more common when the treatment is an  invasive one, but it can also happen with the use of medications, such as those used in the treatment of cancer. The damage can occur to small secondary nerves, or to large primary ones, such as those in the spinal cord and brain. This damage may be temporary or permanent and it may lead to impairments that can range from temporary numbness to permanent paralysis and/or brain death. Allergic Reactions: Any time a substance or material comes in contact with our body, there is the possibility of an allergic reaction. These can range from a mild skin rash (contact dermatitis) to a severe systemic reaction (anaphylactic reaction), which can result in death. Death: In general, any medical intervention can result in death, most of the time due to an unforeseen complication. ____________________________________________________________________________________________   ____________________________________________________________________________________________  Genicular Nerve Block  What is a genicular nerve block? A genicular nerve block is the injection of a local anesthetic to block the nerves that transmits pain from the knee.  What is the purpose of a facet nerve block? A genicular nerve block is a diagnostic procedure to determine if the pathologic changes (i.e. arthritis, meniscal tears, etc) and inflammation within the knee joint is the source of your knee pain. It also confirms that the knee pain will respond well to the actual treatment procedure. If a genicular nerve block works, it will give you relief for several hours. After that, the pain is expected to return to normal. This test is always performed twice (usually a week or two apart) because two successful tests are required to move onto treatment. If both diagnostic tests are positive, then we schedule a treatment called radiofrequency (RF) ablation. In this procedure, the same nerves are cauterized, which typically leads to pain relief for 4  -18 months. If this process works well for one knee, it can be performed on the other knee if needed.  How is the procedure performed? You will be placed on the procedure table. The injection site is sterilized with either iodine or chlorhexadine. The site to be injected is numbed with a local anesthetic, and a needle is directed to the target area. X-ray guidance is used to ensure proper placement and positioning of the needle. When the needle is properly positioned near the genicular nerve, local anesthetic is injected to numb that nerve. This will be repeated at multiple sites around the knee to block all genicular nerves.  Will the procedure be painful? The injection can be painful and we therefore provide the option of receiving IV sedation. IV sedation, combined with local anesthetic, can make the injection nearly pain free. It allows you to remain very still during the procedure, which can also make the injection easier, faster, and more successful. If you decide to have IV sedation, you must have a driver to get you home safely afterwards. In addition, you cannot have anything to eat or drink within 8 hours of your appointment (clear liquids are allowed until 3 hours before the procedure). If you take medications for diabetes, these medications may need to be adjusted the morning of the procedure. Your primary care physician can help you with this adjustment.  What are the discharge instructions? If you received IV sedation do not drive or operate machinery for at least 24 hours after the procedure. You may return to work the next day following your procedure. You may resume your normal diet immediately. Do not engage in any strenuous activity for 24 hours. You should, however, engage in moderate activity that  typically causes your ususal pain. If the block works, those activities should not be painful for several hours after the injection. Do not take a bath, swim, or use a hot tub for 24 hours  (you may take a shower). Call the office if you have any of the following: severe pain afterwards (different than your usual symptoms), redness/swelling/discharge at the injection site(s), fevers/chills, difficulty with bowel or bladder functions.  What are the risks and side effects? The complication rate for this procedure is very low. Whenever a needle enters the skin, bleeding or infection can occur. Some other serious but extremely rare risks include paralysis and death. You may have an allergic reaction to any of the medications used. If you have a known allergy to any medications, especially local anesthetics, notify our staff before the procedure takes place. You may experience any of the following side effects up to 4 - 6 hours after the procedure: Leg muscle weakness or numbness may occur due to the local anesthetic affecting the nerves that control your legs (this is a temporary affect and it is not paralysis). If you have any leg weakness or numbness, walk only with assistance in order to prevent falls and injury. Your leg strength will return slowly and completely. Dizziness may occur due to a decrease in your blood pressure. If this occurs, remain in a seated or lying position. Gradually sit up, and then stand after at least 10 minutes of sitting. Mild headaches may occur. Drink fluids and take pain medications if needed. If the headaches persist or become severe, call the office. Mild discomfort at the injection site can occur. This typically lasts for a few hours but can persist for a couple days. If this occurs, take anti-inflammatories or pain medications, apply ice to the area the day of the procedure. If it persists, apply moist heat in the day(s) following.  The side effects listed above can be normal. They are not dangerous and will resolve on their own. If, however, you experience any of the following, a complication may have occurred and you should either contact your doctor. If he  is not readily available, then you should proceed to the closest urgent care center for evaluation: Severe or progressive pain at the injection site(s) Arm or leg weakness that progressively worsens or persists for longer than 8 hours Severe or progressive redness, swelling, or discharge from the injections site(s) Fevers, chills, nausea, or vomiting Bowel or bladder dysfunction (i.e. inability to urinate or pass stool or difficulty controlling either)  How long does it take for the procedure to work? You should feel relief from your usual pain within the first hour. Again, this is only expected to last for several hours, at the most. Remember, you may be sore in the middle part of your back from the needles, and you must distinguish this from your usual pain. ____________________________________________________________________________________________   ______________________________________________________________________  Preparing for your procedure  During your procedure appointment there will be: No Prescription Refills. No disability issues to discussed. No medication changes or discussions.  Instructions: Food intake: Avoid eating anything solid for at least 8 hours prior to your procedure. Clear liquid intake: You may take clear liquids such as water up to 2 hours prior to your procedure. (No carbonated drinks. No soda.) Transportation: Unless otherwise stated by your physician, bring a driver. Morning Medicines: Except for blood thinners, take all of your other morning medications with a sip of water. Make sure to take your heart and blood  pressure medicines. If your blood pressure's lower number is above 100, the case will be rescheduled. Blood thinners: Make sure to stop your blood thinners as instructed.  If you take a blood thinner, but were not instructed to stop it, call our office [phone] and ask to talk to a nurse. Not stopping a blood thinner prior to certain  procedures could lead to serious complications. Diabetics on insulin: Notify the staff so that you can be scheduled 1st case in the morning. If your diabetes requires high dose insulin, take only  of your normal insulin dose the morning of the procedure and notify the staff that you have done so. Preventing infections: Shower with an antibacterial soap the morning of your procedure.  Build-up your immune system: Take 1000 mg of Vitamin C with every meal (3 times a day) the day prior to your procedure. Antibiotics: Inform the nursing staff if you are taking any antibiotics or if you have any conditions that may require antibiotics prior to procedures. (Example: recent joint implants)   Pregnancy: If you are pregnant make sure to notify the nursing staff. Not doing so may result in injury to the fetus, including death.  Sickness: If you have a cold, fever, or any active infections, call and cancel or reschedule your procedure. Receiving steroids while having an infection may result in complications. Arrival: You must be in the facility at least 30 minutes prior to your scheduled procedure. Tardiness: Your scheduled time is also the cutoff time. If you do not arrive at least 15 minutes prior to your procedure, you will be rescheduled.  Children: Do not bring any children with you. Make arrangements to keep them home. Dress appropriately: There is always a possibility that your clothing may get soiled. Avoid long dresses. Valuables: Do not bring any jewelry or valuables.  Reasons to call and reschedule or cancel your procedure: (Following these recommendations will minimize the risk of a serious complication.) Surgeries: Avoid having procedures within 2 weeks of any surgery. (Avoid for 2 weeks before or after any surgery). Flu Shots: Avoid having procedures within 2 weeks of a flu shots or . (Avoid for 2 weeks before or after immunizations). Barium: Avoid having a procedure within 7-10 days after having  had a radiological study involving the use of radiological contrast. (Myelograms, Barium swallow or enema study). Heart attacks: Avoid any elective procedures or surgeries for the initial 6 months after a "Myocardial Infarction" (Heart Attack). Blood thinners: It is imperative that you stop these medications before procedures. Let us know if you if you take any blood thinner.  Infection: Avoid procedures during or within two weeks of an infection (including chest colds or gastrointestinal problems). Symptoms associated with infections include: Localized redness, fever, chills, night sweats or profuse sweating, burning sensation when voiding, cough, congestion, stuffiness, runny nose, sore throat, diarrhea, nausea, vomiting, cold or Flu symptoms, recent or current infections. It is specially important if the infection is over the area that we intend to treat. Heart and lung problems: Symptoms that may suggest an active cardiopulmonary problem include: cough, chest pain, breathing difficulties or shortness of breath, dizziness, ankle swelling, uncontrolled high or unusually low blood pressure, and/or palpitations. If you are experiencing any of these symptoms, cancel your procedure and contact your primary care physician for an evaluation.  Remember:  Regular Business hours are:  Monday to Thursday 8:00 AM to 4:00 PM  Provider's Schedule: Milinda Pointer, MD:  Procedure days: Tuesday and Thursday 7:30 AM to  4:00 PM  Gillis Santa, MD:  Procedure days: Monday and Wednesday 7:30 AM to 4:00 PM  ______________________________________________________________________

## 2022-09-14 IMAGING — MR MR KNEE*R* W/O CM
4 of 7 series · 19 of 40 positions shown · non-contrast
Comparison: MRI 03/27/2019.

CLINICAL DATA: Right knee popping and swelling since 7373. Previous
steroid injections. No previous relevant surgery.

EXAM:
MRI OF THE RIGHT KNEE WITHOUT CONTRAST
TECHNIQUE: Multiplanar, multisequence MR imaging of the knee was performed. No
intravenous contrast was administered.

[Series 3: T2 fat-sat · axial · 4.0mm · 0.29mm/px · z∈[-59,+11]mm · 3 of 25 slices shown]
[im 5/25]
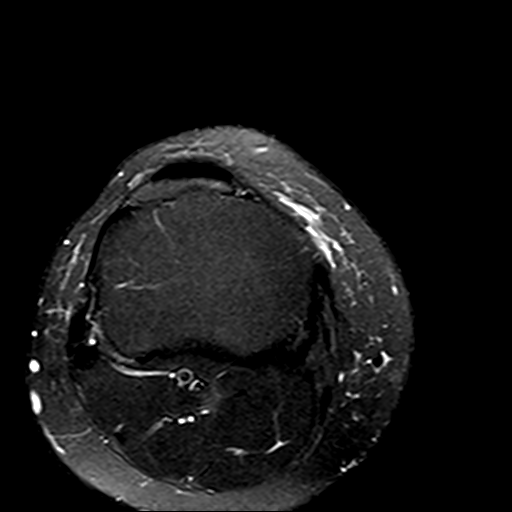
[im 13/25]
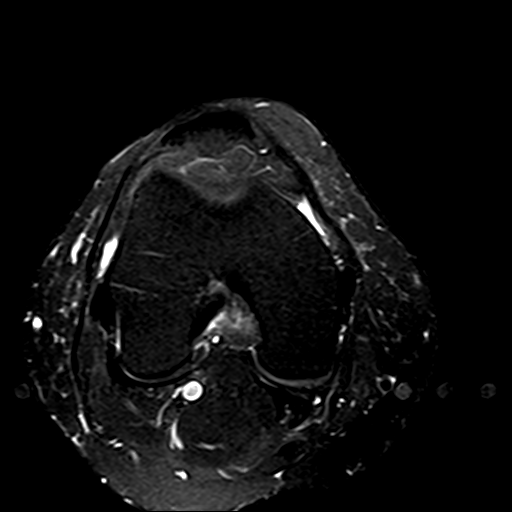
[im 21/25]
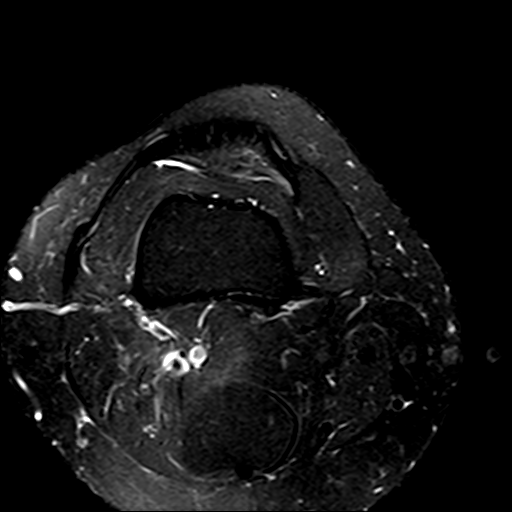

[Series 6: PD fat-sat · coronal · 3.0mm · 0.29mm/px · 7 of 32 slices shown (1 of 3)]
[im 1/32]
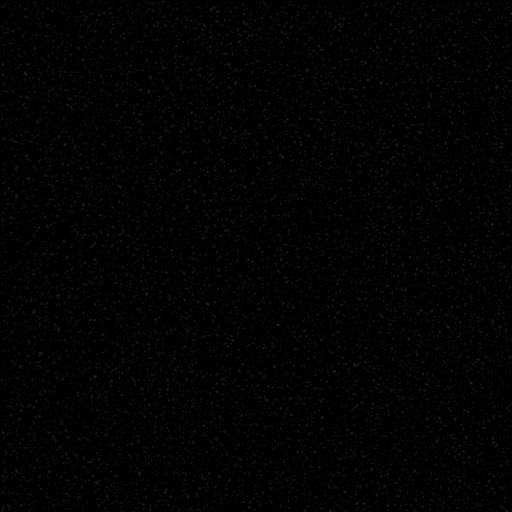
[im 6/32]
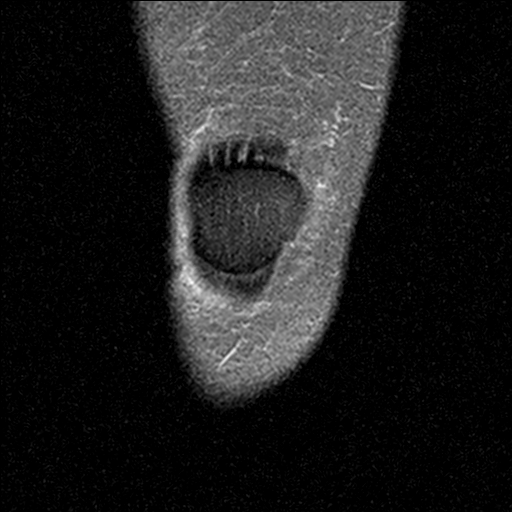
[im 11/32]
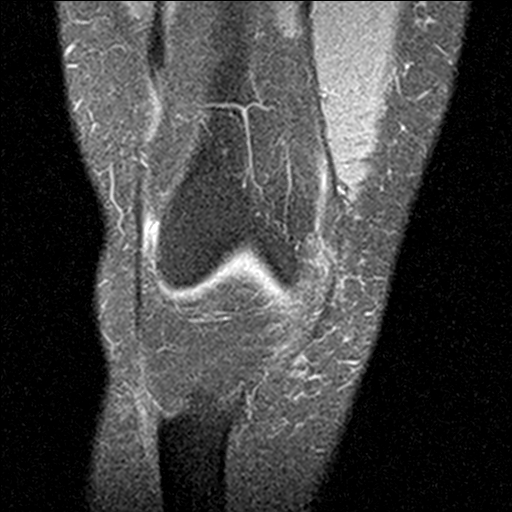
[im 16/32]
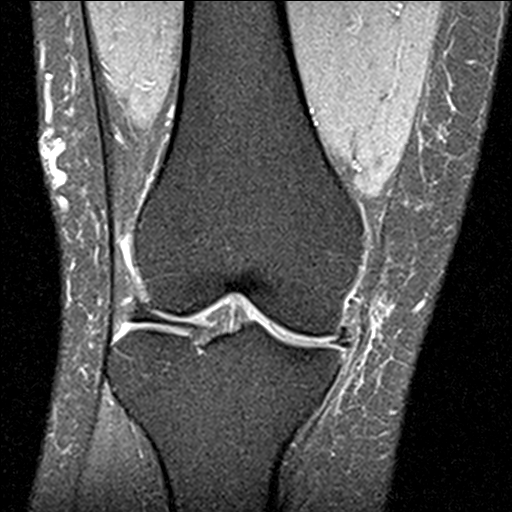
[im 21/32]
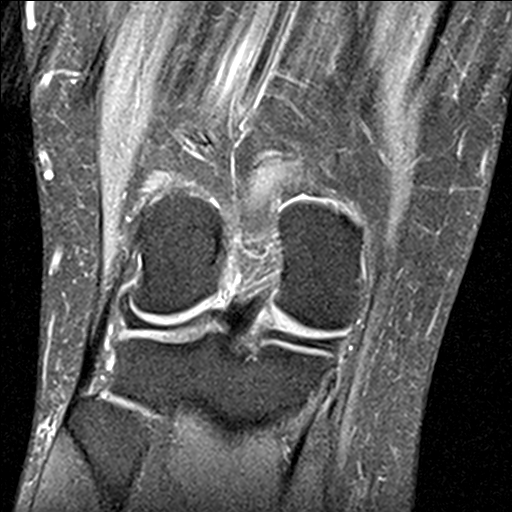
[im 26/32]
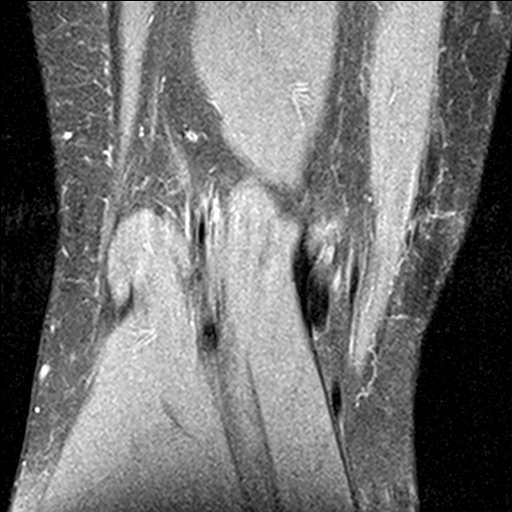
[im 32/32]
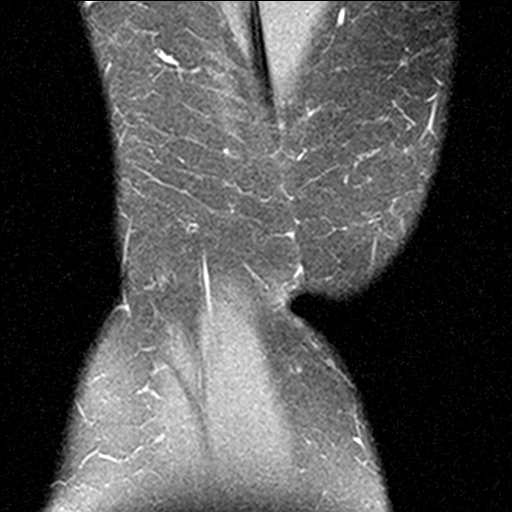

[Series 7: PD fat-sat · sagittal · 3.0mm · 0.29mm/px · 7 of 30 slices shown (2 of 3)]
[im 1/30]
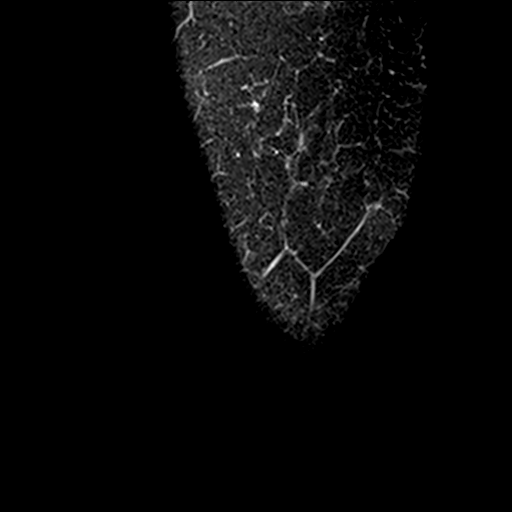
[im 5/30]
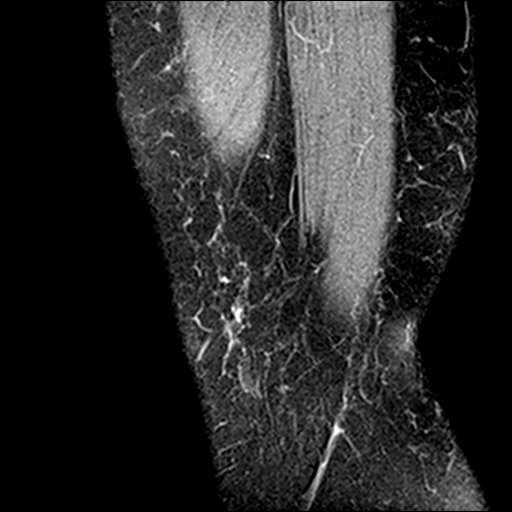
[im 10/30]
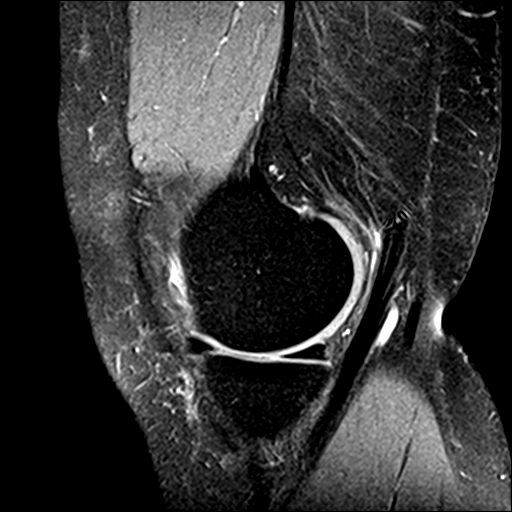
[im 15/30]
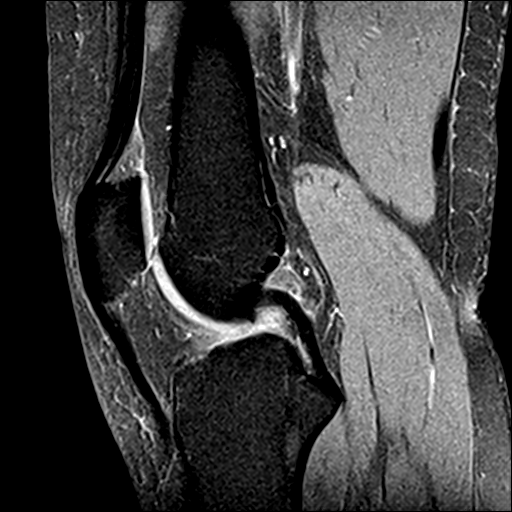
[im 20/30]
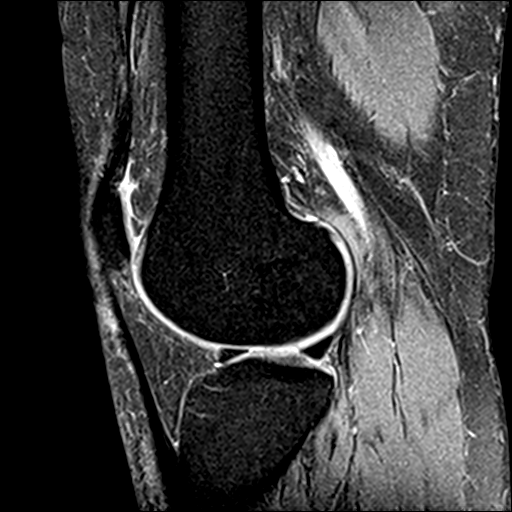
[im 25/30]
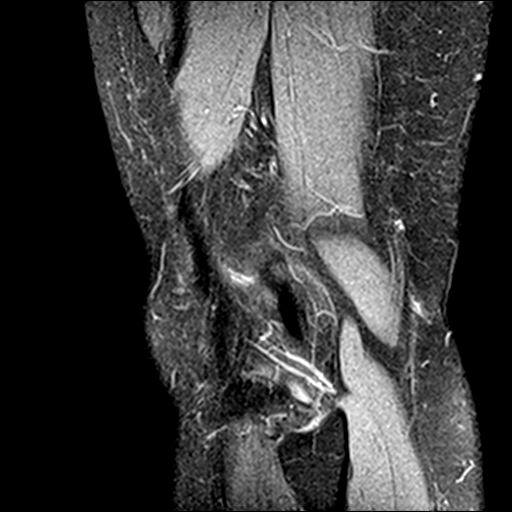
[im 30/30]
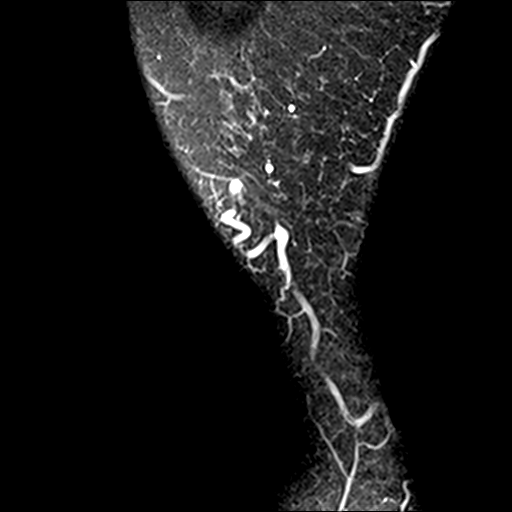

[Series 9: PD fat-sat · coronal · 2.0mm · 0.29mm/px · 2 of 11 slices shown (3 of 3)]
[im 1/11]
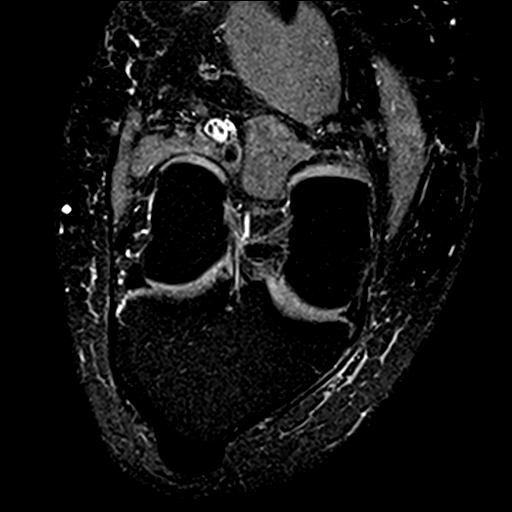
[im 11/11]
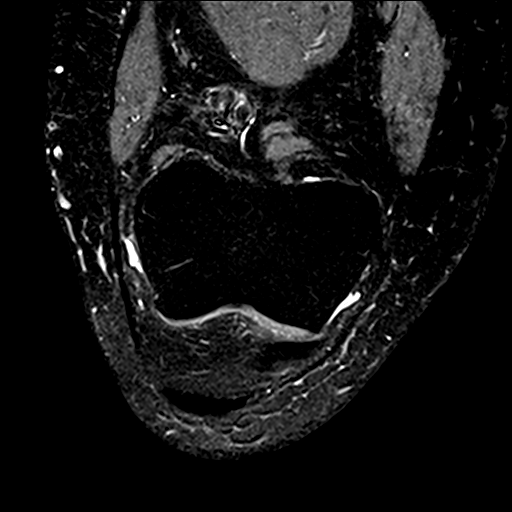

[19 of 40 positions shown; findings below may reference images not displayed]

FINDINGS: MENISCI

Medial meniscus:  Intact with normal morphology.

Lateral meniscus:  Intact with normal morphology.

LIGAMENTS

Cruciates:  Intact.

Collaterals:  Intact.

CARTILAGE

Patellofemoral: Preserved. No focal chondral defect seen currently.

Medial:  Preserved.

Lateral:  Preserved.

MISCELLANEOUS

Joint:  No significant joint effusion.

Popliteal Fossa: The popliteus muscle and tendon are intact. No
significant Baker's cyst.

Extensor Mechanism:  Intact.

Bones:  No acute or significant extra-articular osseous findings.

Other: Mild subcutaneous edema superficial to the medial patellar
retinaculum. No focal fluid collection.
IMPRESSION: No evidence of internal derangement, acute osseous abnormality or
significant arthropathy. Mild nonspecific subcutaneous edema
superficial to the medial patellar retinaculum.

## 2023-01-06 ENCOUNTER — Encounter: Payer: Self-pay | Admitting: Emergency Medicine

## 2023-01-06 ENCOUNTER — Other Ambulatory Visit: Payer: Managed Care, Other (non HMO)

## 2023-01-06 ENCOUNTER — Emergency Department
Admission: EM | Admit: 2023-01-06 | Discharge: 2023-01-06 | Disposition: A | Discharge status: Home / Self Care | Payer: Managed Care, Other (non HMO) | Attending: Emergency Medicine | Admitting: Emergency Medicine

## 2023-01-06 ENCOUNTER — Emergency Department: Payer: Managed Care, Other (non HMO)

## 2023-01-06 ENCOUNTER — Other Ambulatory Visit: Payer: Self-pay

## 2023-01-06 DIAGNOSIS — S4991XA Unspecified injury of right shoulder and upper arm, initial encounter: Secondary | ICD-10-CM | POA: Diagnosis present

## 2023-01-06 DIAGNOSIS — W19XXXA Unspecified fall, initial encounter: Secondary | ICD-10-CM

## 2023-01-06 DIAGNOSIS — W108XXA Fall (on) (from) other stairs and steps, initial encounter: Secondary | ICD-10-CM | POA: Insufficient documentation

## 2023-01-06 MED ORDER — MELOXICAM 15 MG PO TABS: 15.0000 mg | ORAL_TABLET | Freq: Every day | ORAL | 0 refills | Status: AC

## 2023-01-06 MED ORDER — OXYCODONE-ACETAMINOPHEN 5-325 MG PO TABS
1.0000 | ORAL_TABLET | Freq: Once | ORAL | Status: AC
  Administered 2023-01-06: 1 via ORAL
  Filled 2023-01-06: qty 1

## 2023-01-06 MED ORDER — KETOROLAC TROMETHAMINE 30 MG/ML IJ SOLN
30.0000 mg | Freq: Once | INTRAMUSCULAR | Status: AC
  Administered 2023-01-06: 30 mg via INTRAVENOUS
  Filled 2023-01-06: qty 1

## 2023-01-06 MED ORDER — ONDANSETRON HCL 4 MG/2ML IJ SOLN
4.0000 mg | Freq: Once | INTRAMUSCULAR | Status: AC
  Administered 2023-01-06: 4 mg via INTRAVENOUS
  Filled 2023-01-06: qty 2

## 2023-01-06 MED ORDER — HYDROCODONE-ACETAMINOPHEN 5-325 MG PO TABS: 1.0000 | ORAL_TABLET | ORAL | 0 refills | Status: AC | PRN

## 2023-01-06 NOTE — ED Provider Notes (Signed)
[facility] Provider Note  Patient Contact: 6:42 PM (approximate)   History   Fall and Arm Injury   HPI  Sandra Boyer is a 42 y.o. female who presents emergency department complaining of right shoulder/arm pain.  Patient had a mechanical fall where she missed a step, she was holding her grandkids and fell landing on her arm.  Patient has pain to the shoulder and arm.  No obvious deformity.  Patient received fentanyl and route by EMS.  Did not hit her head, did not lose consciousness, denies any other injury or complaint at this time.     Physical Exam   Triage Vital Signs: ED Triage Vitals  Encounter Vitals Group     BP 01/06/23 1533 123/70     Systolic BP Percentile --      Diastolic BP Percentile --      Pulse Rate 01/06/23 1533 64     Resp 01/06/23 1533 16     Temp 01/06/23 1533 98 F (36.7 C)     Temp Source 01/06/23 1533 Oral     SpO2 01/06/23 1533 100 %     Weight 01/06/23 1536 186 lb (84.4 kg)     Height 01/06/23 1536 5\' 7"  (1.702 m)     Head Circumference --      Peak Flow --      Pain Score --      Pain Loc --      Pain Education --      Exclude from Growth Chart --     Most recent vital signs: Vitals:   01/06/23 1533 01/06/23 1534  BP: 123/70   Pulse: 64   Resp: 16   Temp: 98 F (36.7 C)   SpO2: 100% 100%     General: Alert and in no acute distress. Head: No acute traumatic findings  Neck: No stridor. No cervical spine tenderness to palpation.  Cardiovascular:  Good peripheral perfusion Respiratory: Normal respiratory effort without tachypnea or retractions. Lungs CTAB. Good air entry to the bases with no decreased or absent breath sounds. Musculoskeletal: Full range of motion to all extremities.  No obvious deformity to the right upper extremity, tender diffusely along the superior aspect of the humerus without palpable abnormality.  Pulses sensation tact distally. Neurologic:  No gross focal neurologic deficits are  appreciated.  Skin:   No rash noted Other:   ED Results / Procedures / Treatments   Labs (all labs ordered are listed, but only abnormal results are displayed) Labs Reviewed - No data to display   EKG     RADIOLOGY  I personally viewed, evaluated, and interpreted these images as part of my medical decision making, as well as reviewing the written report by the radiologist.  ED Provider Interpretation: No acute traumatic finding on the humerus.  DG Humerus Right  Result Date: 01/06/2023 CLINICAL DATA:  Post fall downstairs, now with shoulder pain. EXAM: RIGHT HUMERUS - 2+ VIEW COMPARISON:  Chest radiograph-04/16/2022 FINDINGS: No fracture or dislocation. Acromioclavicular and glenohumeral joint spaces appear preserved given obliquity and large field of view. There is an apparent peripherally corticated ossicle adjacent to the inferior aspect of the right acromioclavicular joint. The elbow joint appears normal given obliquity and large field of view. No definite elbow joint effusion Regional soft tissues appear normal. Limited visualization of the adjacent thorax is normal. Regional soft tissues appear normal. IMPRESSION: No definite fracture or dislocation. Electronically Signed   By: Holland Commons.D.  On: 01/06/2023 17:05    PROCEDURES:  Critical Care performed: No  Procedures   MEDICATIONS ORDERED IN ED: Medications  ketorolac (TORADOL) 30 MG/ML injection 30 mg (has no administration in time range)  oxyCODONE-acetaminophen (PERCOCET/ROXICET) 5-325 MG per tablet 1 tablet (has no administration in time range)  ondansetron (ZOFRAN) injection 4 mg (4 mg Intravenous Given 01/06/23 1537)     IMPRESSION / MDM / ASSESSMENT AND PLAN / ED COURSE  I reviewed the triage vital signs and the nursing notes.                                 Differential diagnosis includes, but is not limited to, shoulder dislocation, shoulder fracture, humerus fracture, contusion   Patient's  presentation is most consistent with acute presentation with potential threat to life or bodily function.   Patient's diagnosis is consistent with fall, right arm injury.  Patient presents emergency department after mechanical fall.  She landed on her right arm/shoulder.  X-rays are reassuring with no acute traumatic finding.  No dislocation or fracture.  Patient will be given a sling for symptom improvement, anti-inflammatory limited pain medication.  Follow-up with orthopedics as needed. Patient is given ED precautions to return to the ED for any worsening or new symptoms.     FINAL CLINICAL IMPRESSION(S) / ED DIAGNOSES   Final diagnoses:  Fall, initial encounter  Injury of right shoulder, initial encounter     Rx / DC Orders   ED Discharge Orders          Ordered    meloxicam (MOBIC) 15 MG tablet  Daily        01/06/23 1846    HYDROcodone-acetaminophen (NORCO/VICODIN) 5-325 MG tablet  Every 4 hours PRN        01/06/23 1846             Note:  This document was prepared using Dragon voice recognition software and may include unintentional dictation errors.   Lanette Hampshire 01/06/23 1850    Minna Antis, MD 01/06/23 1925

## 2023-01-06 NOTE — ED Triage Notes (Signed)
Per [facility] pt fell down 5 stairs holding grandchild. Was able to catch herself with her right arm. Having pain to right upper arm. 20G L hand. Given fentanyl en route.

## 2023-01-06 NOTE — ED Notes (Signed)
Pt reports falling down about 6 steps. Pt complaining of right shoulder pain with arm/hand swelling.

## 2023-01-10 NOTE — Group Note (Deleted)

## 2023-11-02 ENCOUNTER — Encounter (HOSPITAL_COMMUNITY): Payer: Self-pay

## 2023-11-02 ENCOUNTER — Emergency Department (HOSPITAL_COMMUNITY)

## 2023-11-02 ENCOUNTER — Emergency Department (HOSPITAL_COMMUNITY): Admission: EM | Admit: 2023-11-02 | Discharge: 2023-11-02 | Disposition: A | Discharge status: Home / Self Care

## 2023-11-02 ENCOUNTER — Other Ambulatory Visit: Payer: Self-pay

## 2023-11-02 DIAGNOSIS — R509 Fever, unspecified: Secondary | ICD-10-CM | POA: Insufficient documentation

## 2023-11-02 DIAGNOSIS — R0602 Shortness of breath: Secondary | ICD-10-CM | POA: Diagnosis not present

## 2023-11-02 DIAGNOSIS — R051 Acute cough: Secondary | ICD-10-CM | POA: Diagnosis not present

## 2023-11-02 DIAGNOSIS — R059 Cough, unspecified: Secondary | ICD-10-CM | POA: Diagnosis present

## 2023-11-02 LAB — RESP PANEL BY RT-PCR (RSV, FLU A&B, COVID)  RVPGX2
Influenza A by PCR: NEGATIVE
Influenza B by PCR: NEGATIVE
Resp Syncytial Virus by PCR: NEGATIVE
SARS Coronavirus 2 by RT PCR: NEGATIVE

## 2023-11-02 MED ORDER — BENZONATATE 100 MG PO CAPS
100.0000 mg | ORAL_CAPSULE | Freq: Three times a day (TID) | ORAL | 0 refills | Status: AC
Start: 2023-11-02 — End: ?

## 2023-11-02 MED ORDER — ACETAMINOPHEN 325 MG PO TABS
650.0000 mg | ORAL_TABLET | Freq: Once | ORAL | Status: AC
  Administered 2023-11-02: 650 mg via ORAL
  Filled 2023-11-02: qty 2

## 2023-11-02 MED ORDER — FLUTICASONE PROPIONATE 50 MCG/ACT NA SUSP
2.0000 | Freq: Once | NASAL | Status: AC
  Administered 2023-11-02: 2 via NASAL
  Filled 2023-11-02: qty 16

## 2023-11-02 MED ORDER — FLUTICASONE PROPIONATE 50 MCG/ACT NA SUSP
2.0000 | Freq: Every day | NASAL | 2 refills | Status: AC
Start: 2023-11-02 — End: ?

## 2023-11-02 NOTE — ED Provider Notes (Signed)
 [place] EMERGENCY DEPARTMENT AT [facility] Provider Note   CSN: [id number] Arrival date & time: 11/02/23  1221     Patient presents with: Cough, Fever, and Shortness of Breath   Sandra Boyer is a 43 y.o. female patient presents the emergency department today for further evaluation of shortness of breath, cough, fever.  Cough has been present for couple of weeks but the shortness of breath and fever are new.  She states that her baby at home is also sick with similar symptoms.  Patient denies any abdominal symptoms including abdominal pain, nausea, vomiting, diarrhea.  No urinary symptoms.  Patient states that she works 2 full-time jobs and only gets about 2 hours of sleep at night.  She has been using an inhaler which has been providing some relief. 103 temperature at home.     Cough Associated symptoms: fever and shortness of breath   Fever Associated symptoms: cough   Shortness of Breath Associated symptoms: cough and fever        Prior to Admission medications   Medication Sig Start Date End Date Taking? Authorizing Provider  benzonatate  (TESSALON ) 100 MG capsule Take 1 capsule (100 mg total) by mouth every 8 (eight) hours. 11/02/23  Yes Theotis, Gwyn Mehring M, PA-C  fluticasone  (FLONASE ) 50 MCG/ACT nasal spray Place 2 sprays into both nostrils daily. 11/02/23  Yes Theotis, Landrey Mahurin M, PA-C  albuterol (VENTOLIN HFA) 108 (90 Base) MCG/ACT inhaler Inhale 2 puffs into the lungs every 6 (six) hours as needed for wheezing or shortness of breath.    [provider]  Azelastine HCl 137 MCG/SPRAY SOLN Place into the nose.    [provider]  cetirizine  (ZYRTEC  ALLERGY) 10 MG tablet Take 1 tablet (10 mg total) by mouth 2 (two) times daily. 03/27/22 05/20/22  Cyrena Mylar, MD  HYDROcodone -acetaminophen  (NORCO/VICODIN) 5-325 MG tablet Take 1 tablet by mouth every 4 (four) hours as needed for moderate pain. 01/06/23 01/06/24  Cuthriell, Dorn BIRCH, PA-C  ibuprofen  (ADVIL ) 800 MG  tablet Take 1 tablet (800 mg total) by mouth every 8 (eight) hours as needed. 02/15/22   Ward, Josette SAILOR, DO  levonorgestrel (PLAN B 1-STEP) 1.5 MG tablet Take 1.5 mg by mouth once.    [provider]  meloxicam  (MOBIC ) 15 MG tablet Take 1 tablet (15 mg total) by mouth daily. 01/06/23 01/06/24  Cuthriell, Dorn BIRCH, PA-C  Multiple Vitamins-Calcium (ONE-A-DAY WOMENS FORMULA PO) Take by mouth.    [provider]  omeprazole (PRILOSEC) 20 MG capsule Take 20 mg by mouth daily.    [provider]  ondansetron  (ZOFRAN -ODT) 4 MG disintegrating tablet Take 1 tablet (4 mg total) by mouth every 6 (six) hours as needed for nausea or vomiting. 02/15/22   Ward, Josette SAILOR, DO    Allergies: Carbamazepine, Iodinated contrast media, Mangifera indica, Oxcarbazepine, and Phenytoin    Review of Systems  Constitutional:  Positive for fever.  Respiratory:  Positive for cough and shortness of breath.   All other systems reviewed and are negative.   Updated Vital Signs BP (!) 112/52 (BP Location: Right Arm)   Pulse 72   Temp 98.6 F (37 C) (Oral)   Resp 18   SpO2 100%   Physical Exam Vitals and nursing note reviewed.  Constitutional:      General: She is not in acute distress.    Appearance: Normal appearance.  HENT:     Head: Normocephalic and atraumatic.  Eyes:     General:  Right eye: No discharge.        Left eye: No discharge.  Cardiovascular:     Comments: Regular rate and rhythm.  S1/S2 are distinct without any evidence of murmur, rubs, or gallops.  Radial pulses are 2+ bilaterally.  Dorsalis pedis pulses are 2+ bilaterally.  No evidence of pedal edema. Pulmonary:     Comments: Clear to auscultation bilaterally.  Normal effort.  No respiratory distress.  No evidence of wheezes, rales, or rhonchi heard throughout. Abdominal:     General: Abdomen is flat. Bowel sounds are normal. There is no distension.     Tenderness: There is no abdominal tenderness. There is no  guarding or rebound.  Musculoskeletal:        General: Normal range of motion.     Cervical back: Neck supple.  Skin:    General: Skin is warm and dry.     Findings: No rash.  Neurological:     General: No focal deficit present.     Mental Status: She is alert.  Psychiatric:        Mood and Affect: Mood normal.        Behavior: Behavior normal.     (all labs ordered are listed, but only abnormal results are displayed) Labs Reviewed  RESP PANEL BY RT-PCR (RSV, FLU A&B, COVID)  [id number]    EKG: None  Radiology: DG Chest 2 View Result Date: 11/02/2023 CLINICAL DATA:  Shortness of breath.  Cough.  Fever.  Headache. EXAM: CHEST - 2 VIEW COMPARISON:  06/17/2021 FINDINGS: Cardiomediastinal silhouette and pulmonary vasculature are within normal limits. Lungs are clear. IMPRESSION: No acute cardiopulmonary process. Electronically Signed   By: Aliene Lloyd M.D.   On: 11/02/2023 13:38     Procedures   Medications Ordered in the ED  acetaminophen  (TYLENOL ) tablet 650 mg (650 mg Oral Given 11/02/23 1332)  fluticasone  (FLONASE ) 50 MCG/ACT nasal spray 2 spray (2 sprays Each Nare Given 11/02/23 1358)    Clinical Course as of 11/02/23 1432  Wed Nov 02, 2023  1427 On repeat evaluation, patient states her sinus headache is feeling a little bit better.  She is feeling better overall.  I went over all labs and imaging with her at the bedside.  We can give her a couple of days off of work as she is only getting a couple hours of sleep due to working those 2 jobs.  Patient agreeable with plan.  I will send her home with some cough medicine and some Flonase  as well. [CF]    Clinical Course User Index [CF] Theotis Cameron HERO, PA-C    Medical Decision Making Sandra Boyer is a 43 y.o. female patient who presents to the emergency department today for further evaluation of cough, fever, and shortness of breath.  Patient is nontoxic-appearing.  She is afebrile here and oxygenating at 100% on room air.  Will  go ahead and swab her for COVID, flu, RSV, and get a chest x-ray looking for pneumonia.  I will also give her some Flonase  and some Tylenol  for her headache.  Patient overall feeling better.  Low suspicion for sepsis at this time.  Chest x-ray did not reveal any signs of pneumonia.  This is likely an upper respiratory viral infection.  Will treat conservatively for this.  Will give her Tessalon  Perles and Flonase  to go home with.  I will also give her a couple of days off of work.  Strict turn precautions were discussed.  I will have  her follow-up with her primary care doctor.  She is safer discharge at this time.    Amount and/or Complexity of Data Reviewed Radiology: ordered.  Risk OTC drugs. Prescription drug management.     Final diagnoses:  Acute cough    ED Discharge Orders          Ordered    benzonatate  (TESSALON ) 100 MG capsule  Every 8 hours        11/02/23 1428    fluticasone  (FLONASE ) 50 MCG/ACT nasal spray  Daily        11/02/23 1428               Theotis Peers [place], [place] 11/02/23 1432    Simon Lavonia SAILOR, MD 11/02/23 [phone]

## 2023-11-02 NOTE — Discharge Instructions (Signed)
 As we discussed, this is likely a viral upper respiratory infection that will resolve on its own.  No evidence of pneumonia or serious viral illness today like COVID, flu, or RSV.  Will write you 2 prescriptions.  The first is Tessalon  Perles which help with the cough.  Please keep this out of reach of small children as it can be harmful for them.  The other is the Flonase  which will go in to your nostrils.  Please take both of these as prescribed.  I would like for you to follow-up with your primary care doctor for further evaluation.  You may return to the emergency department for any worsening symptoms.

## 2023-11-02 NOTE — ED Triage Notes (Signed)
 Pt c.o cough, fever, headache for the past week

## 2023-11-29 ENCOUNTER — Emergency Department
Admission: EM | Admit: 2023-11-29 | Discharge: 2023-11-29 | Disposition: A | Discharge status: Home / Self Care | Attending: Emergency Medicine | Admitting: Emergency Medicine

## 2023-11-29 ENCOUNTER — Emergency Department

## 2023-11-29 ENCOUNTER — Other Ambulatory Visit: Payer: Self-pay

## 2023-11-29 DIAGNOSIS — R079 Chest pain, unspecified: Secondary | ICD-10-CM | POA: Insufficient documentation

## 2023-11-29 DIAGNOSIS — I10 Essential (primary) hypertension: Secondary | ICD-10-CM | POA: Diagnosis not present

## 2023-11-29 DIAGNOSIS — J45909 Unspecified asthma, uncomplicated: Secondary | ICD-10-CM | POA: Insufficient documentation

## 2023-11-29 LAB — D-DIMER, QUANTITATIVE: D-Dimer, Quant: <0.27 ug{FEU}/mL (ref 0.00–0.50)

## 2023-11-29 LAB — BASIC METABOLIC PANEL WITH GFR
Anion gap: 7 (ref 5–15)
BUN: 12 mg/dL (ref 6–20)
CO2: 24 mmol/L (ref 22–32)
Calcium: 8.9 mg/dL (ref 8.9–10.3)
Chloride: 106 mmol/L (ref 98–111)
Creatinine, Ser: 0.95 mg/dL (ref 0.44–1.00)
GFR, Estimated: >60 mL/min (ref >60)
Glucose, Bld: 97 mg/dL (ref 70–99)
Potassium: 4.1 mmol/L (ref 3.5–5.1)
Sodium: 137 mmol/L (ref 135–145)

## 2023-11-29 LAB — TROPONIN I (HIGH SENSITIVITY): Troponin I (High Sensitivity): 3 ng/L (ref <18)

## 2023-11-29 LAB — CBC
HCT: 39.5 % (ref 36.0–46.0)
Hemoglobin: 12.6 g/dL (ref 12.0–15.0)
MCH: 26.3 pg (ref 26.0–34.0)
MCHC: 31.9 g/dL (ref 30.0–36.0)
MCV: 82.3 fL (ref 80.0–100.0)
Platelets: 294 K/uL (ref 150–400)
RBC: 4.8 MIL/uL (ref 3.87–5.11)
RDW: 13.2 % (ref 11.5–15.5)
WBC: 7.5 K/uL (ref 4.0–10.5)
nRBC: 0 % (ref 0.0–0.2)

## 2023-11-29 LAB — HCG, QUANTITATIVE, PREGNANCY: hCG, Beta Chain, Quant, S: <1 m[IU]/mL (ref <5)

## 2023-11-29 MED ORDER — LIDOCAINE 5 % EX PTCH
1.0000 | MEDICATED_PATCH | CUTANEOUS | Status: DC
  Administered 2023-11-29: 1 via TRANSDERMAL
  Filled 2023-11-29: qty 1

## 2023-11-29 MED ORDER — KETOROLAC TROMETHAMINE 15 MG/ML IJ SOLN
15.0000 mg | Freq: Once | INTRAMUSCULAR | Status: AC
  Administered 2023-11-29: 15 mg via INTRAMUSCULAR
  Filled 2023-11-29: qty 1

## 2023-11-29 MED ORDER — LIDOCAINE 5 % EX PTCH: 1.0000 | MEDICATED_PATCH | CUTANEOUS | 0 refills | Status: AC

## 2023-11-29 MED ORDER — ACETAMINOPHEN 500 MG PO TABS
1000.0000 mg | ORAL_TABLET | Freq: Once | ORAL | Status: AC
  Administered 2023-11-29: 1000 mg via ORAL
  Filled 2023-11-29: qty 2

## 2023-11-29 NOTE — ED Notes (Signed)
 This tech unsuccessful with blood draw, called lab but lab doesn't have anyone to send due to morning lab time in the hospital,RN [place] informed.

## 2023-11-29 NOTE — ED Triage Notes (Signed)
 Pt to ED via EMS from work, pt reports chest pain x 4days, pt reports only cardiac hx is HTN. Pt reports pain is worse with inspiration.

## 2023-11-29 NOTE — ED Provider Notes (Signed)
 [name] Provider Note    Event Date/Time   First MD Initiated Contact with Patient 11/29/23 0740     (approximate)   History   Chest Pain   HPI  Sandra Boyer is a 43 y.o. female with history of asthma, hypertension, presenting with chest pain for last 4 days.  No prior cardiac history.  He reported that pain is worse with inspiration.  States that pain is right-sided anterior, sharp, nonradiating, intermittent.  Denies any cough or fever.  No shortness of breath.  No body aches.  Pain is reproducible.  No recent travel or surgeries, no unilateral calf sign or tenderness, no prior cardiac history.  Has seen a cardiologist in the past due to her blood pressure and was cleared.  On independent chart review, she was seen by her primary care doctor at the end of June, she is on hydrochlorothiazide 25 mg daily.  Has been stable on that medication.     Physical Exam   Triage Vital Signs: ED Triage Vitals  Encounter Vitals Group     BP 11/29/23 0450 (!) 140/90     Girls Systolic BP Percentile --      Girls Diastolic BP Percentile --      Boys Systolic BP Percentile --      Boys Diastolic BP Percentile --      Pulse Rate 11/29/23 0450 (!) 56     Resp 11/29/23 0450 17     Temp 11/29/23 0450 97.6 F (36.4 C)     Temp src --      SpO2 11/29/23 0450 100 %     Weight 11/29/23 0447 182 lb (82.6 kg)     Height 11/29/23 0447 5' 7 (1.702 m)     Head Circumference --      Peak Flow --      Pain Score 11/29/23 0447 9     Pain Loc --      Pain Education --      Exclude from Growth Chart --     Most recent vital signs: Vitals:   11/29/23 0450  BP: (!) 140/90  Pulse: (!) 56  Resp: 17  Temp: 97.6 F (36.4 C)  SpO2: 100%     General: Awake, no distress.  CV:  Good peripheral perfusion.  Resp:  Normal effort.  Clear, right anterior thoracic cage is tender to palpation Abd:  No distention.  Soft nontender Other:  No lower extremity edema, no  unilateral calf sign or tenderness   ED Results / Procedures / Treatments   Labs (all labs ordered are listed, but only abnormal results are displayed) Labs Reviewed  BASIC METABOLIC PANEL WITH GFR  CBC  D-DIMER, QUANTITATIVE  HCG, QUANTITATIVE, PREGNANCY  POC URINE PREG, ED  TROPONIN I (HIGH SENSITIVITY)     EKG  EKG shows, sinus bradycardia, rate 57, normal QRS, normal QTc, no obvious ischemic ST elevation, T wave flattening in 3, no prior to compare   RADIOLOGY On my independent interpretation, chest x-ray without obvious consolidation   PROCEDURES:  Critical Care performed: No  Procedures   MEDICATIONS ORDERED IN ED: Medications  ketorolac  (TORADOL ) 15 MG/ML injection 15 mg (has no administration in time range)  lidocaine  (LIDODERM ) 5 % 1 patch (has no administration in time range)  acetaminophen  (TYLENOL ) tablet 1,000 mg (has no administration in time range)     IMPRESSION / MDM / ASSESSMENT AND PLAN / ED COURSE  I reviewed the triage vital  signs and the nursing notes.                              Differential diagnosis includes, but is not limited to, angina, ACS, musculoskeletal pain, strain, costochondritis.  Considered PE but this is less likely since patient is not tachycardic or hypoxic, no unilateral calf swelling or tenderness, but given that she describes it as worse with inspiration, D-dimer was ordered, labs, chest x-ray.  Patient's presentation is most consistent with acute presentation with potential threat to life or bodily function.  Independent interpretation of labs and imaging below.  Will give her some Tylenol , Toradol  as well as Lidoderm  patch.  Discussed with patient about imaging and lab results, also discussed following up with your primary care doctor for further management of her symptoms.  She can take ibuprofen  every 6 hours as needed for pain, also gave her prescription for Lidoderm  patches.  Considered but no indication for inpatient  admission at this time, she safe for outpatient management.  Will discharge with strict return precautions.  Shared decision making done with patient and she is agreeable with this plan.    Clinical Course as of 11/29/23 0811  Tue Nov 29, 2023  0724 DG Chest 2 View 1. No acute process.  [TT]  0745 Independent review of labs, troponin D-dimer not elevated, electrolytes not severely deranged, no leukocytosis. [TT]  0809 hCG, quantitative, pregnancy Not elevated. [TT]    Clinical Course User Index [TT] Waymond Lorelle Cummins, MD     FINAL CLINICAL IMPRESSION(S) / ED DIAGNOSES   Final diagnoses:  Chest pain, unspecified type     Rx / DC Orders   ED Discharge Orders          Ordered    lidocaine  (LIDODERM ) 5 %  Every 24 hours        11/29/23 0758             Note:  This document was prepared using Dragon voice recognition software and may include unintentional dictation errors.    Waymond Lorelle Cummins, MD 11/29/23 [phone]

## 2023-11-29 NOTE — Discharge Instructions (Addendum)
 You can take 600 mg of ibuprofen  every 6 hours as needed for pain.  Also use the Lidoderm  patches that I prescribed.

## 2023-11-29 NOTE — Telephone Encounter (Signed)
 OPENING STATEMENT GIVEN. PT VERIFIED WITH 3 HIPAA VERIFIERS.   PT IS EXPERIENCING ANY NEW OR WORSENING SYMPTOMS   Patient calling in due to chest pain, shortness of breath, and loss of sensation in right hand. Caller Verbalizes understanding of care advice. Closing statement given.  please do not respond directly to this Afterhours Triage Nurse message  Reason for Disposition . [1] Numbness (i.e., loss of sensation) of the face, arm / hand, or leg / foot on one side of the body AND [2] sudden onset AND [3] present now . [1] Chest pain lasts > 5 minutes AND [2] age > 30 AND [3] at least one cardiac risk factor (i.e., hypertension, diabetes, obesity, smoker or strong family history of heart disease)  Additional Information . Negative: [1] SEVERE weakness (i.e., unable to walk or barely able to walk, requires support) AND [2] new onset or worsening . Negative: [1] Weakness (i.e., paralysis, loss of muscle strength) of the face, arm / hand, or leg / foot on one side of the body AND [2] sudden onset AND [3] present now . Negative: [1] Loss of speech or garbled speech AND [2] sudden onset AND [3] present now . Negative: Severe difficulty breathing (e.g., struggling for each breath, speaks in single words) . Negative: Difficult to awaken or acting confused (e.g., disoriented, slurred speech) . Negative: Shock suspected (e.g., cold/pale/clammy skin, too weak to stand, low BP, rapid pulse) . Negative: [1] Chest pain lasts > 5 minutes AND [2] history of heart disease  (i.e., heart attack, bypass surgery, angina, angioplasty, CHF; not just a heart murmur) . Negative: [1] Chest pain lasts > 5 minutes AND [2] described as crushing, pressure-like, or heavy . Negative: [1] Chest pain lasts > 5 minutes AND [2] age > 35  Answer Assessment - Initial Assessment Questions 1. SYMPTOM: What is the main symptom you are concerned about? (e.g., weakness, numbness)  2. ONSET: When did this start? (minutes, hours,  days; while sleeping) last night  3. LAST NORMAL: When was the last time you were normal (no symptoms)?  4. PATTERN Does this come and go, or has it been constant since it started?  Is it present now?  5. CARDIAC SYMPTOMS: Have you had any of the following symptoms: chest pain, difficulty breathing, palpitations?  6. NEUROLOGIC SYMPTOMS: Have you had any of the following symptoms: headache, dizziness, vision loss, double vision, changes in speech, unsteady on your feet? tingling /loss of sensation on left side  7. OTHER SYMPTOMS: Do you have any other symptoms?  8. PREGNANCY: Is there any chance you are pregnant? When was your last menstrual period? Response:  Answer Assessment - Initial Assessment Questions 1. LOCATION: Where does it hurt?   right side  2. RADIATION: Does the pain go anywhere else? (e.g., into neck, jaw, arms, back) denies  3. ONSET: When did the chest pain begin? (Minutes, hours or days)  4 days  4. PATTERN Does the pain come and go, or has it been constant since it started?  Does it get worse with exertion?  comes and goes  5. DURATION: How long does it last (e.g., seconds, minutes, hours) 10-15 minutes  6. SEVERITY: How bad is the pain?  (e.g., Scale 1-10; mild, moderate, or severe)    - MILD (1-3): doesn't interfere with normal activities     - MODERATE (4-7): interferes with normal activities or awakens from sleep    - SEVERE (8-10): excruciating pain, unable to do any normal activities  10 7. CARDIAC RISK FACTORS: Do you have any history of heart problems or risk factors for heart disease? (e.g., prior heart attack, angina; high blood pressure, diabetes, being overweight, high cholesterol, smoking, or strong family history of heart disease)  8. PULMONARY RISK FACTORS: Do you have any history of lung disease?  (e.g., blood clots in lung, asthma, emphysema, birth control pills)  9. CAUSE: What do you think is causing the  chest pain?  10. OTHER SYMPTOMS: Do you have any other symptoms? (e.g., dizziness, nausea, vomiting, sweating, fever, difficulty breathing, cough)  11. PREGNANCY: Is there any chance you are pregnant? When was your last menstrual period? Response:  Protocols used: Neurologic Deficit-A-AH, Chest Pain-A-AH

## 2023-12-24 ENCOUNTER — Emergency Department (HOSPITAL_COMMUNITY)
Admission: EM | Admit: 2023-12-24 | Discharge: 2023-12-24 | Disposition: A | Discharge status: Home / Self Care | Attending: Emergency Medicine | Admitting: Emergency Medicine

## 2023-12-24 ENCOUNTER — Other Ambulatory Visit: Payer: Self-pay

## 2023-12-24 ENCOUNTER — Encounter (HOSPITAL_COMMUNITY): Payer: Self-pay

## 2023-12-24 DIAGNOSIS — I10 Essential (primary) hypertension: Secondary | ICD-10-CM | POA: Insufficient documentation

## 2023-12-24 DIAGNOSIS — L03119 Cellulitis of unspecified part of limb: Secondary | ICD-10-CM

## 2023-12-24 DIAGNOSIS — J45909 Unspecified asthma, uncomplicated: Secondary | ICD-10-CM | POA: Insufficient documentation

## 2023-12-24 DIAGNOSIS — L03116 Cellulitis of left lower limb: Secondary | ICD-10-CM | POA: Diagnosis not present

## 2023-12-24 DIAGNOSIS — M79672 Pain in left foot: Secondary | ICD-10-CM | POA: Diagnosis present

## 2023-12-24 MED ORDER — CEPHALEXIN 500 MG PO CAPS: 500.0000 mg | ORAL_CAPSULE | Freq: Four times a day (QID) | ORAL | 0 refills | Status: AC

## 2023-12-24 MED ORDER — ACETAMINOPHEN 325 MG PO TABS
650.0000 mg | ORAL_TABLET | Freq: Once | ORAL | Status: AC
  Administered 2023-12-24: 650 mg via ORAL
  Filled 2023-12-24: qty 2

## 2023-12-24 MED ORDER — CEPHALEXIN 250 MG PO CAPS
500.0000 mg | ORAL_CAPSULE | Freq: Once | ORAL | Status: AC
  Administered 2023-12-24: 500 mg via ORAL
  Filled 2023-12-24: qty 2

## 2023-12-24 MED ORDER — FLUCONAZOLE 150 MG PO TABS
150.0000 mg | ORAL_TABLET | Freq: Once | ORAL | Status: AC
  Administered 2023-12-24: 150 mg via ORAL
  Filled 2023-12-24: qty 1

## 2023-12-24 MED ORDER — NAPROXEN 250 MG PO TABS
500.0000 mg | ORAL_TABLET | Freq: Once | ORAL | Status: AC
  Administered 2023-12-24: 500 mg via ORAL
  Filled 2023-12-24: qty 2

## 2023-12-24 NOTE — ED Triage Notes (Signed)
 Pt bib POV.  Pt states she woke up this morning with bilateral swelling. Pt left foot is painful to touch. Pt second and third toe present with redness and tender between toes. Pt says when you separate the toes to apply ointment she felt a tearing sensation.   Pt takes fluid pills and BP meds consistently.   Pt doesn't have sensation in right foot d/t previous surgery. Pt has bilateral dorsal pedis pulses +2   Pt denies fever, chills, n/v

## 2023-12-24 NOTE — Discharge Instructions (Addendum)
 At this time, we suspect that most likely you have cellulitis over your foot. We did not complete an x-ray of your foot, as there is no trauma involved, however if you do not respond to the antibiotics then you will need to see a podiatrist or primary care doctor for further assessment.  Return to the emergency room if you start having swelling to the entire foot going up to the ankle.  Weightbearing allowed as tolerated with crutches.  Please keep the postop shoe on anytime you are walking.

## 2023-12-24 NOTE — ED Provider Notes (Signed)
 [place] EMERGENCY DEPARTMENT AT [facility] Provider Note   CSN: [id number] Arrival date & time: 12/24/23  1017     Patient presents with: Foot Pain   Sandra Boyer is a 43 y.o. female.   HPI     43 year old patient comes in with chief complaint of foot pain. Patient has a history of asthma, seizures.  She states that she woke up yesterday with pain in her left foot.  She noted that there was pain between the 2nd and 3rd toe, and over the toe themselves.  Over over the day, she noted that she had more more difficulty walking.  She applied antifungal ointment between the toes, but with no relief.  Review of system is negative for any nausea, vomiting, fevers, chills.  Patient has a history of hypertension and chronic swelling in her legs.   Prior to Admission medications   Medication Sig Start Date End Date Taking? Authorizing Provider  cephALEXin  (KEFLEX ) 500 MG capsule Take 1 capsule (500 mg total) by mouth 4 (four) times daily. 12/24/23  Yes Melchizedek Espinola, MD  albuterol (VENTOLIN HFA) 108 (90 Base) MCG/ACT inhaler Inhale 2 puffs into the lungs every 6 (six) hours as needed for wheezing or shortness of breath.    [provider]  Azelastine HCl 137 MCG/SPRAY SOLN Place into the nose.    [provider]  benzonatate  (TESSALON ) 100 MG capsule Take 1 capsule (100 mg total) by mouth every 8 (eight) hours. 11/02/23   Theotis Peers M, PA-C  cetirizine  (ZYRTEC  ALLERGY) 10 MG tablet Take 1 tablet (10 mg total) by mouth 2 (two) times daily. 03/27/22 05/20/22  Cyrena Mylar, MD  fluticasone  (FLONASE ) 50 MCG/ACT nasal spray Place 2 sprays into both nostrils daily. 11/02/23   Theotis Peers HERO, PA-C  HYDROcodone -acetaminophen  (NORCO/VICODIN) 5-325 MG tablet Take 1 tablet by mouth every 4 (four) hours as needed for moderate pain. 01/06/23 01/06/24  Cuthriell, Dorn BIRCH, PA-C  ibuprofen  (ADVIL ) 800 MG tablet Take 1 tablet (800 mg total) by mouth every 8 (eight) hours as  needed. 02/15/22   Ward, Josette SAILOR, DO  levonorgestrel (PLAN B 1-STEP) 1.5 MG tablet Take 1.5 mg by mouth once.    [provider]  lidocaine  (LIDODERM ) 5 % Place 1 patch onto the skin daily. Remove & Discard patch within 12 hours or as directed by MD 11/29/23 12/29/23  Waymond Lorelle Cummins, MD  meloxicam  (MOBIC ) 15 MG tablet Take 1 tablet (15 mg total) by mouth daily. 01/06/23 01/06/24  Cuthriell, Dorn BIRCH, PA-C  Multiple Vitamins-Calcium (ONE-A-DAY WOMENS FORMULA PO) Take by mouth.    [provider]  omeprazole (PRILOSEC) 20 MG capsule Take 20 mg by mouth daily.    [provider]  ondansetron  (ZOFRAN -ODT) 4 MG disintegrating tablet Take 1 tablet (4 mg total) by mouth every 6 (six) hours as needed for nausea or vomiting. 02/15/22   Ward, Josette SAILOR, DO    Allergies: Carbamazepine, Iodinated contrast media, Mangifera indica, Oxcarbazepine, and Phenytoin    Review of Systems  All other systems reviewed and are negative.   Updated Vital Signs BP 134/82 (BP Location: Right Arm)   Pulse 69   Temp 98.5 F (36.9 C)   Resp 20   Ht 5' 7 (1.702 m)   Wt 82.6 kg   SpO2 98%   BMI 28.51 kg/m   Physical Exam Vitals and nursing note reviewed.  Constitutional:      Appearance: She is well-developed.  HENT:  Head: Atraumatic.  Cardiovascular:     Rate and Rhythm: Normal rate.  Pulmonary:     Effort: Pulmonary effort is normal.  Musculoskeletal:     Cervical back: Normal range of motion and neck supple.     Comments: Patient has significant discomfort with palpation of both digit 2 and 3. There is no skin breakdown noted between digits 2 and 3 and no drainage  Skin:    General: Skin is warm and dry.     Findings: Erythema present.     Comments: Erythema noted over digits 2 through 5, most prominent on the digits 2 and 3  Neurological:     Mental Status: She is alert and oriented to person, place, and time.     (all labs ordered are listed, but only abnormal results  are displayed) Labs Reviewed - No data to display  EKG: None  Radiology: No results found.   Procedures   Medications Ordered in the ED  naproxen  (NAPROSYN ) tablet 500 mg (500 mg Oral Given 12/24/23 1324)  acetaminophen  (TYLENOL ) tablet 650 mg (650 mg Oral Given 12/24/23 1324)  cephALEXin  (KEFLEX ) capsule 500 mg (500 mg Oral Given 12/24/23 1324)  fluconazole  (DIFLUCAN ) tablet 150 mg (150 mg Oral Given 12/24/23 1324)                                    Medical Decision Making Risk OTC drugs. Prescription drug management.    43 year old female with history of hypertension comes in with chief complaint of foot pain.  There is no preceding trauma.  Patient woke up with pain in her left foot, specifically over digits 2 and 3, that is just intensified over time.  Differential diagnosis considered for this patient includes fracture, cellulitis, fungal infection with superimposed cellulitis, osteomyelitis, septic arthritis.  Patient is immunocompetent.  No trauma involved.  She has no systemic symptoms of infection.  We did discuss x-rays, but patient doubts that there is any traumatic injury.  I have overall low suspicion for osteomyelitis given overall patient being pretty healthy and immunocompetent.  Plan is for us  to start Keflex .  Will give her Doxy here.  She will continue to use the antifungal ointment between her toes.  She will follow-up with her podiatrist or PCP next week if not getting better.  Return precautions discussed.  Final diagnoses:  Cellulitis of foot    ED Discharge Orders          Ordered    cephALEXin  (KEFLEX ) 500 MG capsule  4 times daily        12/24/23 1323               Charlyn Sora, MD 12/24/23 1334

## 2023-12-25 ENCOUNTER — Telehealth (HOSPITAL_COMMUNITY): Payer: Self-pay | Admitting: Emergency Medicine

## 2023-12-25 NOTE — Telephone Encounter (Signed)
 New work note sent.
